# Patient Record
Sex: Male | Born: 1985 | Race: White | Hispanic: No | Marital: Married | Smoking: Never smoker
Health system: Southern US, Community
[De-identification: ages and names within clinical notes are randomized; demographics above are authoritative.]

## PROBLEM LIST (undated history)

## (undated) DIAGNOSIS — M7732 Calcaneal spur, left foot: Secondary | ICD-10-CM

## (undated) DIAGNOSIS — T148XXA Other injury of unspecified body region, initial encounter: Secondary | ICD-10-CM

## (undated) DIAGNOSIS — K219 Gastro-esophageal reflux disease without esophagitis: Secondary | ICD-10-CM

## (undated) DIAGNOSIS — F909 Attention-deficit hyperactivity disorder, unspecified type: Secondary | ICD-10-CM

## (undated) DIAGNOSIS — G43909 Migraine, unspecified, not intractable, without status migrainosus: Secondary | ICD-10-CM

## (undated) DIAGNOSIS — Z8489 Family history of other specified conditions: Secondary | ICD-10-CM

## (undated) HISTORY — DX: Attention-deficit hyperactivity disorder, unspecified type: F90.9

## (undated) HISTORY — PX: OTHER SURGICAL HISTORY: SHX169

## (undated) HISTORY — PX: NO PAST SURGERIES: SHX2092

---

## 2014-12-24 DIAGNOSIS — M7732 Calcaneal spur, left foot: Secondary | ICD-10-CM

## 2014-12-24 DIAGNOSIS — IMO0002 Reserved for concepts with insufficient information to code with codable children: Secondary | ICD-10-CM

## 2014-12-24 HISTORY — DX: Calcaneal spur, left foot: M77.32

## 2014-12-24 HISTORY — DX: Reserved for concepts with insufficient information to code with codable children: IMO0002

## 2015-01-17 ENCOUNTER — Other Ambulatory Visit: Payer: Self-pay | Admitting: Physician Assistant

## 2015-01-17 NOTE — H&P (Signed)
Ethan Gomez comes in for follow up.  I went over his MRI scan.  Continues to be markedly symptomatic.  This has been off and on, coming and going, for more than a year.  All symptoms are lateral.  MRI shows marked tendinopathy and tearing of the peroneus brevis tendon.  There is even a large tubercle on the os calcis at the peroneal site with edema within the bone.  This is right where he has a painful knot.  DISPOSITION:  I went over his scan, his report, his history, his workup and treatment to date.  More than 25 minutes spent face-to-face covering things with him.  This has been going on for more than a year and in talking with him it is almost two years.  We have discussed operative intervention that he wants to pursue.  Plan is exploration laterally, debridement and repair of peroneus brevis tendon.  If this is markedly torn I told him I am going to resect that portion and do a side-to-side transfer to the longus proximally and resect a portion of the tendon distally.  At the same time we will address the prominent bone of the os calcis, contouring and denuding that as well.  I think this can be done outpatient.  Bulky splint non-weight bearing for one week and then I will let him be weight bearing as tolerated in a boot for up to six weeks post-op.  I don't think anything short of this is going to get things to resolve and he understands and agrees.  Paperwork complete.  All questions answered.  We need to make a decision about work and how long he is going to be out and whether this can be modified.  That will be addressed when he gets some more information about light duty options.  See him at the time of operative intervention.    Loreta Ave, M.D.

## 2015-01-21 ENCOUNTER — Encounter (HOSPITAL_BASED_OUTPATIENT_CLINIC_OR_DEPARTMENT_OTHER): Payer: Self-pay | Admitting: *Deleted

## 2015-01-24 ENCOUNTER — Other Ambulatory Visit: Payer: Self-pay | Admitting: Physician Assistant

## 2015-01-24 NOTE — H&P (Signed)
This is a 28 year-old gentleman who presents to our clinic today for follow up examination of his left peroneal tendonitis.  This has been ongoing for the past six weeks.  Ethan Gomez has been in a CAM walker weight bearing as tolerated for the past three weeks or so.  He states this is minimally improved, but still continues to be quite bothersome.  He has been taking Tramadol which does seem to help.  He is on his feet all day and feels as though the pain is worse at night.  He continues to have significant swelling towards the end of the day however.   Past medical, social and family history reviewed in detail on the patient questionnaire and signed.  Review of systems: As detailed in HPI.  All others reviewed and are negative.      EXAMINATION: Well-developed, well-nourished male in no acute distress.  Alert and oriented x 3.  Examination of his left ankle reveals marked tenderness at the peroneal attachment.  Pain with eversion.  Mild swelling.   IMPRESSION: Left peroneal tendonitis.    PLAN: Due to the fact this has been ongoing for the past six weeks and the pain is still fairly dramatic, we feel it is appropriate to proceed with an MRI to assess his peroneal tendon.  If there is in fact a split tear with an intact tendon, we will have him continue in a CAM walker and let this continue to heal on its own.  If he does have a transverse tear to the tendon he will follow back up with us as this will need formal operation.  He will call us after his MRI is complete and we will go from there.    Daniel F. Murphy, M.D.    Addendum:  Ethan Gomez comes in for follow up.  I went over his MRI scan.  Continues to be markedly symptomatic.  This has been off and on, coming and going, for more than a year.  All symptoms are lateral.  MRI shows marked tendinopathy and tearing of the peroneus brevis tendon.  There is even a large tubercle on the os calcis at the peroneal site with edema within the bone.  This is right  where he has a painful knot.  DISPOSITION:  I went over his scan, his report, his history, his workup and treatment to date.  More than 25 minutes spent face-to-face covering things with him.  This has been going on for more than a year and in talking with him it is almost two years.  We have discussed operative intervention that he wants to pursue.  Plan is exploration laterally, debridement and repair of peroneus brevis tendon.  If this is markedly torn I told him I am going to resect that portion and do a side-to-side transfer to the longus proximally and resect a portion of the tendon distally.  At the same time we will address the prominent bone of the os calcis, contouring and denuding that as well.  I think this can be done outpatient.  Bulky splint non-weight bearing for one week and then I will let him be weight bearing as tolerated in a boot for up to six weeks post-op.  I don't think anything short of this is going to get things to resolve and he understands and agrees.  Paperwork complete.  All questions answered.  We need to make a decision about work and how long he is going to be out and whether this can   be modified.  That will be addressed when he gets some more information about light duty options.  See him at the time of operative intervention.    Daniel F. Murphy, M.D.  

## 2015-01-26 NOTE — H&P (View-Only) (Signed)
Ethan Gomez comes in for follow up.  I went over his MRI scan.  Continues to be markedly symptomatic.  This has been off and on, coming and going, for more than a year.  All symptoms are lateral.  MRI shows marked tendinopathy and tearing of the peroneus brevis tendon.  There is even a large tubercle on the os calcis at the peroneal site with edema within the bone.  This is right where he has a painful knot.  DISPOSITION:  I went over his scan, his report, his history, his workup and treatment to date.  More than 25 minutes spent face-to-face covering things with him.  This has been going on for more than a year and in talking with him it is almost two years.  We have discussed operative intervention that he wants to pursue.  Plan is exploration laterally, debridement and repair of peroneus brevis tendon.  If this is markedly torn I told him I am going to resect that portion and do a side-to-side transfer to the longus proximally and resect a portion of the tendon distally.  At the same time we will address the prominent bone of the os calcis, contouring and denuding that as well.  I think this can be done outpatient.  Bulky splint non-weight bearing for one week and then I will let him be weight bearing as tolerated in a boot for up to six weeks post-op.  I don't think anything short of this is going to get things to resolve and he understands and agrees.  Paperwork complete.  All questions answered.  We need to make a decision about work and how long he is going to be out and whether this can be modified.  That will be addressed when he gets some more information about light duty options.  See him at the time of operative intervention.    Daniel F. Murphy, M.D.  

## 2015-01-26 NOTE — Interval H&P Note (Signed)
History and Physical Interval Note:  01/26/2015 8:33 AM  Ethan Gomez  has presented today for surgery, with the diagnosis of CALCANEAL SPUR LEFT FOOT, SPONTANEOUS RUPTURE OF OTHER TENDONS LEFT ANKLE AND FOOT  The various methods of treatment have been discussed with the patient and family. After consideration of risks, benefits and other options for treatment, the patient has consented to  Procedure(s) with comments: LEFT HEEL EXCISE OS CALCANEAL SPUR  (Left) - ANESTHESIA: GENERAL, BLOCK REPAIR LEFT PERONEAL TENDON, POSSIBLE TENDON TRANSFER (Left) as a surgical intervention .  The patient's history has been reviewed, patient examined, no change in status, stable for surgery.  I have reviewed the patient's chart and labs.  Questions were answered to the patient's satisfaction.     Loreta Ave

## 2015-01-27 ENCOUNTER — Encounter (HOSPITAL_BASED_OUTPATIENT_CLINIC_OR_DEPARTMENT_OTHER)
Admission: RE | Disposition: A | Discharge status: Home / Self Care | Payer: Self-pay | Source: Ambulatory Visit | Attending: Orthopedic Surgery

## 2015-01-27 ENCOUNTER — Ambulatory Visit (HOSPITAL_BASED_OUTPATIENT_CLINIC_OR_DEPARTMENT_OTHER)
Admission: RE | Admit: 2015-01-27 | Discharge: 2015-01-27 | Disposition: A | Discharge status: Home / Self Care | Payer: 59 | Source: Ambulatory Visit | Attending: Orthopedic Surgery | Admitting: Orthopedic Surgery

## 2015-01-27 ENCOUNTER — Ambulatory Visit (HOSPITAL_BASED_OUTPATIENT_CLINIC_OR_DEPARTMENT_OTHER): Payer: 59 | Admitting: Certified Registered"

## 2015-01-27 ENCOUNTER — Encounter (HOSPITAL_BASED_OUTPATIENT_CLINIC_OR_DEPARTMENT_OTHER): Payer: Self-pay | Admitting: *Deleted

## 2015-01-27 DIAGNOSIS — S96812A Strain of other specified muscles and tendons at ankle and foot level, left foot, initial encounter: Secondary | ICD-10-CM | POA: Diagnosis not present

## 2015-01-27 DIAGNOSIS — M65872 Other synovitis and tenosynovitis, left ankle and foot: Secondary | ICD-10-CM | POA: Diagnosis present

## 2015-01-27 DIAGNOSIS — X58XXXA Exposure to other specified factors, initial encounter: Secondary | ICD-10-CM | POA: Diagnosis not present

## 2015-01-27 HISTORY — DX: Gastro-esophageal reflux disease without esophagitis: K21.9

## 2015-01-27 HISTORY — PX: BONE EXOSTOSIS EXCISION: SHX1249

## 2015-01-27 HISTORY — DX: Migraine, unspecified, not intractable, without status migrainosus: G43.909

## 2015-01-27 HISTORY — DX: Calcaneal spur, left foot: M77.32

## 2015-01-27 HISTORY — DX: Family history of other specified conditions: Z84.89

## 2015-01-27 HISTORY — DX: Other injury of unspecified body region, initial encounter: T14.8XXA

## 2015-01-27 HISTORY — PX: TENDON REPAIR: SHX5111

## 2015-01-27 LAB — POCT HEMOGLOBIN-HEMACUE: Hemoglobin: 14.9 g/dL (ref 13.0–17.0)

## 2015-01-27 SURGERY — EXCISION, EXOSTOSIS
Anesthesia: Regional | Site: Foot | Laterality: Left

## 2015-01-27 MED ORDER — CHLORHEXIDINE GLUCONATE 4 % EX LIQD: 60.0000 mL | Freq: Once | CUTANEOUS | Status: DC

## 2015-01-27 MED ORDER — LIDOCAINE HCL (CARDIAC) 20 MG/ML IV SOLN
INTRAVENOUS | Status: DC | PRN
  Administered 2015-01-27: 30 mg via INTRAVENOUS

## 2015-01-27 MED ORDER — FENTANYL CITRATE (PF) 100 MCG/2ML IJ SOLN
INTRAMUSCULAR | Status: AC
  Filled 2015-01-27: qty 2

## 2015-01-27 MED ORDER — 0.9 % SODIUM CHLORIDE (POUR BTL) OPTIME
TOPICAL | Status: DC | PRN
  Administered 2015-01-27: 120 mL

## 2015-01-27 MED ORDER — FENTANYL CITRATE (PF) 100 MCG/2ML IJ SOLN
INTRAMUSCULAR | Status: AC
  Filled 2015-01-27: qty 6

## 2015-01-27 MED ORDER — ONDANSETRON HCL 4 MG/2ML IJ SOLN
INTRAMUSCULAR | Status: DC | PRN
  Administered 2015-01-27: 4 mg via INTRAVENOUS

## 2015-01-27 MED ORDER — MIDAZOLAM HCL 2 MG/2ML IJ SOLN
INTRAMUSCULAR | Status: AC
  Filled 2015-01-27: qty 2

## 2015-01-27 MED ORDER — GLYCOPYRROLATE 0.2 MG/ML IJ SOLN: 0.2000 mg | Freq: Once | INTRAMUSCULAR | Status: DC | PRN

## 2015-01-27 MED ORDER — CEFAZOLIN SODIUM-DEXTROSE 2-3 GM-% IV SOLR
INTRAVENOUS | Status: AC
  Filled 2015-01-27: qty 50

## 2015-01-27 MED ORDER — SCOPOLAMINE 1 MG/3DAYS TD PT72: 1.0000 | MEDICATED_PATCH | Freq: Once | TRANSDERMAL | Status: DC | PRN

## 2015-01-27 MED ORDER — ONDANSETRON HCL 4 MG PO TABS: 4.0000 mg | ORAL_TABLET | Freq: Three times a day (TID) | ORAL | Status: DC | PRN

## 2015-01-27 MED ORDER — LACTATED RINGERS IV SOLN: INTRAVENOUS | Status: DC

## 2015-01-27 MED ORDER — DEXAMETHASONE SODIUM PHOSPHATE 10 MG/ML IJ SOLN
INTRAMUSCULAR | Status: DC | PRN
  Administered 2015-01-27: 10 mg via INTRAVENOUS

## 2015-01-27 MED ORDER — PROPOFOL 10 MG/ML IV BOLUS
INTRAVENOUS | Status: DC | PRN
  Administered 2015-01-27: 200 mg via INTRAVENOUS

## 2015-01-27 MED ORDER — CEFAZOLIN SODIUM-DEXTROSE 2-3 GM-% IV SOLR
2.0000 g | INTRAVENOUS | Status: AC
  Administered 2015-01-27: 2 g via INTRAVENOUS

## 2015-01-27 MED ORDER — MIDAZOLAM HCL 2 MG/2ML IJ SOLN
1.0000 mg | INTRAMUSCULAR | Status: DC | PRN
  Administered 2015-01-27: 1 mg via INTRAVENOUS
  Administered 2015-01-27: 2 mg via INTRAVENOUS

## 2015-01-27 MED ORDER — LACTATED RINGERS IV SOLN
INTRAVENOUS | Status: DC
  Administered 2015-01-27 (×3): via INTRAVENOUS

## 2015-01-27 MED ORDER — OXYCODONE-ACETAMINOPHEN 5-325 MG PO TABS: 1.0000 | ORAL_TABLET | ORAL | Status: DC | PRN

## 2015-01-27 MED ORDER — FENTANYL CITRATE (PF) 100 MCG/2ML IJ SOLN
50.0000 ug | INTRAMUSCULAR | Status: DC | PRN
  Administered 2015-01-27 (×2): 100 ug via INTRAVENOUS

## 2015-01-27 SURGICAL SUPPLY — 70 items
BANDAGE ELASTIC 4 VELCRO ST LF (GAUZE/BANDAGES/DRESSINGS) ×4 IMPLANT
BANDAGE ELASTIC 6 VELCRO ST LF (GAUZE/BANDAGES/DRESSINGS) ×4 IMPLANT
BANDAGE ESMARK 6X9 LF (GAUZE/BANDAGES/DRESSINGS) ×2 IMPLANT
BENZOIN TINCTURE PRP APPL 2/3 (GAUZE/BANDAGES/DRESSINGS) ×4 IMPLANT
BLADE AVERAGE 25MMX9MM (BLADE)
BLADE AVERAGE 25X9 (BLADE) IMPLANT
BLADE OSC/SAG .038X5.5 CUT EDG (BLADE) IMPLANT
BLADE SURG 15 STRL LF DISP TIS (BLADE) ×2 IMPLANT
BLADE SURG 15 STRL SS (BLADE) ×2
BNDG COHESIVE 4X5 TAN STRL (GAUZE/BANDAGES/DRESSINGS) ×4 IMPLANT
BNDG ESMARK 6X9 LF (GAUZE/BANDAGES/DRESSINGS) ×4
CANISTER SUCT 1200ML W/VALVE (MISCELLANEOUS) IMPLANT
CLOSURE WOUND 1/2 X4 (GAUZE/BANDAGES/DRESSINGS) ×1
COVER BACK TABLE 60X90IN (DRAPES) ×4 IMPLANT
CUFF TOURNIQUET SINGLE 34IN LL (TOURNIQUET CUFF) ×4 IMPLANT
DECANTER SPIKE VIAL GLASS SM (MISCELLANEOUS) IMPLANT
DRAPE EXTREMITY T 121X128X90 (DRAPE) ×4 IMPLANT
DRAPE OEC MINIVIEW 54X84 (DRAPES) IMPLANT
DRAPE U 20/CS (DRAPES) ×4 IMPLANT
DRAPE U-SHAPE 47X51 STRL (DRAPES) ×4 IMPLANT
DURAPREP 26ML APPLICATOR (WOUND CARE) ×4 IMPLANT
ELECT REM PT RETURN 9FT ADLT (ELECTROSURGICAL) ×4
ELECTRODE REM PT RTRN 9FT ADLT (ELECTROSURGICAL) ×2 IMPLANT
GAUZE SPONGE 4X4 12PLY STRL (GAUZE/BANDAGES/DRESSINGS) ×4 IMPLANT
GAUZE XEROFORM 1X8 LF (GAUZE/BANDAGES/DRESSINGS) ×4 IMPLANT
GLOVE BIOGEL PI IND STRL 7.0 (GLOVE) ×4 IMPLANT
GLOVE BIOGEL PI INDICATOR 7.0 (GLOVE) ×4
GLOVE ECLIPSE 7.0 STRL STRAW (GLOVE) IMPLANT
GLOVE EXAM NITRILE LRG STRL (GLOVE) ×4 IMPLANT
GLOVE ORTHO TXT STRL SZ7.5 (GLOVE) IMPLANT
GLOVE SURG ORTHO 8.0 STRL STRW (GLOVE) IMPLANT
GLOVE SURG SS PI 6.5 STRL IVOR (GLOVE) ×4 IMPLANT
GLOVE SURG SS PI 7.0 STRL IVOR (GLOVE) ×8 IMPLANT
GLOVE SURG SS PI 8.0 STRL IVOR (GLOVE) ×4 IMPLANT
GOWN STRL REUS W/ TWL LRG LVL3 (GOWN DISPOSABLE) ×6 IMPLANT
GOWN STRL REUS W/ TWL XL LVL3 (GOWN DISPOSABLE) ×2 IMPLANT
GOWN STRL REUS W/TWL LRG LVL3 (GOWN DISPOSABLE) ×6
GOWN STRL REUS W/TWL XL LVL3 (GOWN DISPOSABLE) ×2
KIT SUTURETAK 3 SPEAR TROCAR (KITS) IMPLANT
NEEDLE HYPO 25X1 1.5 SAFETY (NEEDLE) IMPLANT
NS IRRIG 1000ML POUR BTL (IV SOLUTION) ×4 IMPLANT
PACK BASIN DAY SURGERY FS (CUSTOM PROCEDURE TRAY) ×4 IMPLANT
PAD CAST 3X4 CTTN HI CHSV (CAST SUPPLIES) IMPLANT
PAD CAST 4YDX4 CTTN HI CHSV (CAST SUPPLIES) ×4 IMPLANT
PADDING CAST COTTON 3X4 STRL (CAST SUPPLIES)
PADDING CAST COTTON 4X4 STRL (CAST SUPPLIES) ×4
PENCIL BUTTON HOLSTER BLD 10FT (ELECTRODE) ×4 IMPLANT
SLEEVE SCD COMPRESS KNEE MED (MISCELLANEOUS) ×4 IMPLANT
SPLINT FAST PLASTER 5X30 (CAST SUPPLIES) ×40
SPLINT FIBERGLASS 3X35 (CAST SUPPLIES) IMPLANT
SPLINT FIBERGLASS 4X30 (CAST SUPPLIES) IMPLANT
SPLINT PLASTER CAST FAST 5X30 (CAST SUPPLIES) ×40 IMPLANT
SPONGE LAP 4X18 X RAY DECT (DISPOSABLE) ×4 IMPLANT
STOCKINETTE 4X48 STRL (DRAPES) ×4 IMPLANT
STRIP CLOSURE SKIN 1/2X4 (GAUZE/BANDAGES/DRESSINGS) ×3 IMPLANT
SUCTION FRAZIER TIP 10 FR DISP (SUCTIONS) IMPLANT
SUT ETHIBOND 2 OS 4 DA (SUTURE) IMPLANT
SUT ETHILON 3 0 PS 1 (SUTURE) ×8 IMPLANT
SUT VIC AB 0 SH 27 (SUTURE) IMPLANT
SUT VIC AB 2-0 SH 27 (SUTURE) ×4
SUT VIC AB 2-0 SH 27XBRD (SUTURE) ×4 IMPLANT
SUT VIC AB 3-0 SH 27 (SUTURE)
SUT VIC AB 3-0 SH 27X BRD (SUTURE) IMPLANT
SUT VICRYL 4-0 PS2 18IN ABS (SUTURE) IMPLANT
SYR BULB 3OZ (MISCELLANEOUS) ×4 IMPLANT
SYR CONTROL 10ML LL (SYRINGE) IMPLANT
TUBE CONNECTING 20'X1/4 (TUBING)
TUBE CONNECTING 20X1/4 (TUBING) IMPLANT
UNDERPAD 30X30 (UNDERPADS AND DIAPERS) ×4 IMPLANT
YANKAUER SUCT BULB TIP NO VENT (SUCTIONS) IMPLANT

## 2015-01-27 NOTE — Anesthesia Procedure Notes (Addendum)
Procedure Name: LMA Insertion Date/Time: 01/27/2015 3:06 PM Performed by: BLOCKER, TIMOTHY D Pre-anesthesia Checklist: Patient identified, Emergency Drugs available, Suction available and Patient being monitored Patient Re-evaluated:Patient Re-evaluated prior to inductionOxygen Delivery Method: Circle System Utilized Preoxygenation: Pre-oxygenation with 100% oxygen Intubation Type: IV induction Ventilation: Mask ventilation without difficulty LMA: LMA inserted LMA Size: 4.5 Number of attempts: 1 Airway Equipment and Method: Bite block Placement Confirmation: positive ETCO2 Tube secured with: Tape Dental Injury: Teeth and Oropharynx as per pre-operative assessment     Anesthesia Regional Block:  Popliteal block  Pre-Anesthetic Checklist: ,, timeout performed, Correct Patient, Correct Site, Correct Laterality, Correct Procedure, Correct Position, site marked, Risks and benefits discussed,  Surgical consent,  Pre-op evaluation,  At surgeon's request and post-op pain management  Laterality: Left and Lower  Prep: chloraprep       Needles:  Injection technique: Single-shot  Needle Type: Echogenic Needle     Needle Length: 9cm 9 cm Needle Gauge: 21 and 21 G    Additional Needles:  Procedures: ultrasound guided (picture in chart) Popliteal block Narrative:  Start time: 02/14/2015 2:36 PM End time: 02/14/2015 2:40 PM Injection made incrementally with aspirations every 5 mL.  Performed by: Personally  Anesthesiologist: Anapaola Kinsel

## 2015-01-27 NOTE — Interval H&P Note (Signed)
History and Physical Interval Note:  01/27/2015 9:45 AM  Ethan Gomez  has presented today for surgery, with the diagnosis of CALCANEAL SPUR LEFT FOOT, SPONTANEOUS RUPTURE OF OTHER TENDONS LEFT ANKLE AND FOOT  The various methods of treatment have been discussed with the patient and family. After consideration of risks, benefits and other options for treatment, the patient has consented to  Procedure(s) with comments: LEFT HEEL EXCISE OS CALCANEAL SPUR  (Left) - ANESTHESIA: GENERAL, BLOCK REPAIR LEFT PERONEAL TENDON, POSSIBLE TENDON TRANSFER (Left) as a surgical intervention .  The patient's history has been reviewed, patient examined, no change in status, stable for surgery.  I have reviewed the patient's chart and labs.  Questions were answered to the patient's satisfaction.     Loreta Ave

## 2015-01-27 NOTE — Anesthesia Preprocedure Evaluation (Signed)
Anesthesia Evaluation  Patient identified by MRN, date of birth, ID band Patient awake    Reviewed: Allergy & Precautions, NPO status , Patient's Chart, lab work & pertinent test results  Airway Mallampati: I  TM Distance: >3 FB Neck ROM: Full    Dental  (+) Teeth Intact, Dental Advisory Given   Pulmonary  breath sounds clear to auscultation        Cardiovascular Rhythm:Regular Rate:Normal     Neuro/Psych    GI/Hepatic GERD-  Medicated and Controlled,  Endo/Other    Renal/GU      Musculoskeletal   Abdominal   Peds  Hematology   Anesthesia Other Findings   Reproductive/Obstetrics                             Anesthesia Physical Anesthesia Plan  ASA: I  Anesthesia Plan: General and Regional   Post-op Pain Management:    Induction: Intravenous  Airway Management Planned: LMA  Additional Equipment:   Intra-op Plan:   Post-operative Plan: Extubation in OR  Informed Consent: I have reviewed the patients History and Physical, chart, labs and discussed the procedure including the risks, benefits and alternatives for the proposed anesthesia with the patient or authorized representative who has indicated his/her understanding and acceptance.   Dental advisory given  Plan Discussed with: CRNA, Anesthesiologist and Surgeon  Anesthesia Plan Comments:         Anesthesia Quick Evaluation

## 2015-01-27 NOTE — Transfer of Care (Signed)
Immediate Anesthesia Transfer of Care Note  Patient: Ethan Gomez  Procedure(s) Performed: Procedure(s) with comments: EXCISION LEFT HEEL OS CALCANEAL SPUR  (Left) - ANESTHESIA: GENERAL, BLOCK REPAIR LEFT PERONEUS LONGUS TENDON (Left)  Patient Location: PACU  Anesthesia Type:GA combined with regional for post-op pain  Level of Consciousness: awake, alert , oriented and patient cooperative  Airway & Oxygen Therapy: Patient Spontanous Breathing and Patient connected to face mask oxygen  Post-op Assessment: Report given to RN and Post -op Vital signs reviewed and stable  Post vital signs: Reviewed and stable  Last Vitals:  Filed Vitals:   01/27/15 1631  BP:   Pulse: 69  Temp:   Resp:     Complications: No apparent anesthesia complications

## 2015-01-27 NOTE — Progress Notes (Signed)
Assisted Dr. Crews with left, ultrasound guided, popliteal block. Side rails up, monitors on throughout procedure. See vital signs in flow sheet. Tolerated Procedure well. 

## 2015-01-27 NOTE — H&P (View-Only) (Signed)
This is a 29 year-old gentleman who presents to our clinic today for follow up examination of his left peroneal tendonitis.  This has been ongoing for the past six weeks.  Sharia Reeve has been in a CAM walker weight bearing as tolerated for the past three weeks or so.  He states this is minimally improved, but still continues to be quite bothersome.  He has been taking Tramadol which does seem to help.  He is on his feet all day and feels as though the pain is worse at night.  He continues to have significant swelling towards the end of the day however.   Past medical, social and family history reviewed in detail on the patient questionnaire and signed.  Review of systems: As detailed in HPI.  All others reviewed and are negative.      EXAMINATION: Well-developed, well-nourished male in no acute distress.  Alert and oriented x 3.  Examination of his left ankle reveals marked tenderness at the peroneal attachment.  Pain with eversion.  Mild swelling.   IMPRESSION: Left peroneal tendonitis.    PLAN: Due to the fact this has been ongoing for the past six weeks and the pain is still fairly dramatic, we feel it is appropriate to proceed with an MRI to assess his peroneal tendon.  If there is in fact a split tear with an intact tendon, we will have him continue in a CAM walker and let this continue to heal on its own.  If he does have a transverse tear to the tendon he will follow back up with [place] as this will need formal operation.  He will call us after his MRI is complete and we will go from there.    Loreta Ave, M.D.    Addendum:  Sagar comes in for follow up.  I went over his MRI scan.  Continues to be markedly symptomatic.  This has been off and on, coming and going, for more than a year.  All symptoms are lateral.  MRI shows marked tendinopathy and tearing of the peroneus brevis tendon.  There is even a large tubercle on the os calcis at the peroneal site with edema within the bone.  This is right  where he has a painful knot.  DISPOSITION:  I went over his scan, his report, his history, his workup and treatment to date.  More than 25 minutes spent face-to-face covering things with him.  This has been going on for more than a year and in talking with him it is almost two years.  We have discussed operative intervention that he wants to pursue.  Plan is exploration laterally, debridement and repair of peroneus brevis tendon.  If this is markedly torn I told him I am going to resect that portion and do a side-to-side transfer to the longus proximally and resect a portion of the tendon distally.  At the same time we will address the prominent bone of the os calcis, contouring and denuding that as well.  I think this can be done outpatient.  Bulky splint non-weight bearing for one week and then I will let him be weight bearing as tolerated in a boot for up to six weeks post-op.  I don't think anything short of this is going to get things to resolve and he understands and agrees.  Paperwork complete.  All questions answered.  We need to make a decision about work and how long he is going to be out and whether this can  be modified.  That will be addressed when he gets some more information about light duty options.  See him at the time of operative intervention.    Loreta Ave, M.D.

## 2015-01-27 NOTE — Discharge Instructions (Signed)
°Care After °Refer to this sheet in the next few weeks. These discharge instructions provide you with general information on caring for yourself after you leave the hospital. Your caregiver may also give you specific instructions. Your treatment has been planned according to the most current medical practices available, but unavoidable complications sometimes occur. If you have any problems or questions after discharge, please call your caregiver. °HOME INSTRUCTIONS °You may resume a normal diet and activities as directed. Walk with crutches NON WEIGHT BEARING °Do NOT get cast wet.  Do NOT remove dressing until you come back to the doctor °Only take over-the-counter or prescription medicines for pain, discomfort, or fever as directed by your caregiver.  °Eat a well-balanced diet.  °Avoid lifting or driving until you are instructed otherwise.  °Make an appointment to see your caregiver for stitches (suture) or staple removal as directed.  ° °SEEK MEDICAL CARE IF: °You have swelling of your calf or leg.  °You develop shortness of breath or chest pain.  °You have redness, swelling, or increasing pain in the wound.  °There is pus or any unusual drainage coming from the surgical site.  °You notice a bad smell coming from the surgical site or dressing.  °The surgical site breaks open after sutures or staples have been removed.  °There is persistent bleeding from the suture or staple line.  °You are getting worse or are not improving.  °You have any other questions or concerns.  °SEEK IMMEDIATE MEDICAL CARE IF:  °You have a fever.  °You develop a rash.  °You have difficulty breathing.  °You develop any reaction or side effects to medicines given.  °Your knee motion is decreasing rather than improving.  °MAKE SURE YOU:  °Understand these instructions.  °Will watch your condition.  °Will get help right away if you are not doing well or get worse.  ° °Regional Anesthesia Blocks ° °1. Numbness or the inability to move the  "blocked" extremity may last from 3-48 hours after placement. The length of time depends on the medication injected and your individual response to the medication. If the numbness is not going away after 48 hours, call your surgeon. ° °2. The extremity that is blocked will need to be protected until the numbness is gone and the  Strength has returned. Because you cannot feel it, you will need to take extra care to avoid injury. Because it may be weak, you may have difficulty moving it or using it. You may not know what position it is in without looking at it while the block is in effect. ° °3. For blocks in the legs and feet, returning to weight bearing and walking needs to be done carefully. You will need to wait until the numbness is entirely gone and the strength has returned. You should be able to move your leg and foot normally before you try and bear weight or walk. You will need someone to be with you when you first try to ensure you do not fall and possibly risk injury. ° °4. Bruising and tenderness at the needle site are common side effects and will resolve in a few days. ° °5. Persistent numbness or new problems with movement should be communicated to the surgeon or the [facility] ([phone])/ [facility] ([phone]). ° °Post Anesthesia Home Care Instructions ° °Activity: °Get plenty of rest for the remainder of the day. A responsible adult should stay with you for 24 hours following the procedure.  °For   the next 24 hours, DO NOT: °-Drive a car °-Operate machinery °-Drink alcoholic beverages °-Take any medication unless instructed by your physician °-Make any legal decisions or sign important papers. ° °Meals: °Start with liquid foods such as gelatin or soup. Progress to regular foods as tolerated. Avoid greasy, spicy, heavy foods. If nausea and/or vomiting occur, drink only clear liquids until the nausea and/or vomiting subsides. Call your physician if vomiting  continues. ° °Special Instructions/Symptoms: °Your throat may feel dry or sore from the anesthesia or the breathing tube placed in your throat during surgery. If this causes discomfort, gargle with warm salt water. The discomfort should disappear within 24 hours. ° °If you had a scopolamine patch placed behind your ear for the management of post- operative nausea and/or vomiting: ° °1. The medication in the patch is effective for 72 hours, after which it should be removed.  Wrap patch in a tissue and discard in the trash. Wash hands thoroughly with soap and water. °2. You may remove the patch earlier than 72 hours if you experience unpleasant side effects which may include dry mouth, dizziness or visual disturbances. °3. Avoid touching the patch. Wash your hands with soap and water after contact with the patch. °  ° ° ° °

## 2015-01-27 NOTE — Anesthesia Postprocedure Evaluation (Signed)
  Anesthesia Post-op Note  Patient: Ethan Gomez  Procedure(s) Performed: Procedure(s) with comments: EXCISION LEFT HEEL OS CALCANEAL SPUR  (Left) - ANESTHESIA: GENERAL, BLOCK REPAIR LEFT PERONEUS LONGUS TENDON (Left)  Patient Location: PACU  Anesthesia Type:General  Level of Consciousness: awake  Airway and Oxygen Therapy: Patient Spontanous Breathing  Post-op Pain: mild  Post-op Assessment: Post-op Vital signs reviewed LLE Motor Response: No movement to painful stimulus LLE Sensation: Other (Comment) (UTA pt asleep)          Post-op Vital Signs: Reviewed  Last Vitals:  Filed Vitals:   01/27/15 1631  BP:   Pulse: 69  Temp:   Resp:     Complications: No apparent anesthesia complications

## 2015-01-28 ENCOUNTER — Encounter (HOSPITAL_BASED_OUTPATIENT_CLINIC_OR_DEPARTMENT_OTHER): Payer: Self-pay | Admitting: Orthopedic Surgery

## 2015-01-28 NOTE — Op Note (Signed)
NAMEDAIQUAN, RESNIK NO.:  [id number]  MEDICAL RECORD NO.:  [id number]  LOCATION:                               FACILITY:  [facility]  PHYSICIAN:  Loreta Ave, M.D. DATE OF BIRTH:  [birth year]  DATE OF PROCEDURE:  01/27/2015 DATE OF DISCHARGE:  01/27/2015                              OPERATIVE REPORT   PREOPERATIVE DIAGNOSES:  Left ankle chronic tenosynovitis, peroneal tendons with tearing of the peroneus longus.  Marked prominence of the calcaneal peroneal tubercle with underlying bony edema.  POSTOPERATIVE DIAGNOSES:  Left ankle chronic tenosynovitis, peroneal tendons with tearing of the peroneus longus.  Marked prominence of the calcaneal peroneal tubercle with underlying bony edema with extensive longitudinal tearing of the peroneus longus, but functionally intact. Tenosynovitis throughout the peroneus brevis, but longitudinally intact. Marked prominence irregularity of the calcaneal peroneal tubercle.  PROCEDURE:  Left ankle exploration.  Tenosynovectomy peroneus brevis. Debridement, longitudinal repair of peroneus longus.  Removal of exostosis prominent tubercle from the lateral os calcis.  SURGEON:  Loreta Ave, M.D.  ASSISTANT:  Mikey Kirschner, PA, present throughout the entire case and necessary for timely completion of procedure.  ANESTHESIA:  General.  BLOOD LOSS:  Minimal.  SPECIMENS:  None.  CULTURES:  None.  COMPLICATIONS:  None.  DRESSING:  Soft compressive.  TOURNIQUET TIME:  45 minutes.  DESCRIPTION OF PROCEDURE:  The patient was brought to the operating room, placed on the operating table in supine position.  After adequate anesthesia had been obtained, thigh tourniquet applied.  Turned to a slight lateral position for access to the outside of the ankle.  Prepped and draped in usual sterile fashion.  Exsanguinated with elevation of Esmarch.  Tourniquet inflated to 350 mmHg.  Longitudinal incision from just behind the  fibula down towards the base of the 5th metatarsal. Skin and subcutaneous tissue divided.  Markedly thickened, swollen peroneal tendon sheaths.  These were all opened exposing both tendons. Retinaculum behind the fibula left intact so it would not sublux. Tenosynovium fluid debrided throughout.  The brevis was intact.  Marked prominence of the peroneal tubercle on the os calcis sitting right under longitudinal tear of the longus as well as to the extent over the brevis.  Both tendons were elevated.  The side of the os calcis contoured smoothly removing all exostosis.  I then debrided out and debulked the longest tendon and did a primary closure of the longitudinal tear throughout.  Wound irrigated.  Wound closed with Vicryl and nylon.  Sterile compressive dressing applied.  Short leg splint applied.  Tourniquet inflated was removed.  Anesthesia reversed. Brought to the recovery room.  Tolerated the surgery well.  No complications.     Loreta Ave, M.D.     DFM/MEDQ  D:  01/28/2015  T:  01/28/2015  Job:  [id number]

## 2015-02-14 MED ORDER — BUPIVACAINE-EPINEPHRINE (PF) 0.5% -1:200000 IJ SOLN
INTRAMUSCULAR | Status: DC | PRN
  Administered 2015-01-27: 25 mL via PERINEURAL

## 2015-02-14 NOTE — Addendum Note (Signed)
Addendum  created 02/14/15 1318 by Sheldon Silvan, MD   Modules edited: Anesthesia Blocks and Procedures, Anesthesia Medication Administration, Clinical Notes   Clinical Notes:  File: [id number]; File: [id number]

## 2015-10-06 ENCOUNTER — Encounter: Payer: Self-pay | Admitting: Internal Medicine

## 2015-10-06 ENCOUNTER — Ambulatory Visit (INDEPENDENT_AMBULATORY_CARE_PROVIDER_SITE_OTHER): Payer: 59 | Admitting: Internal Medicine

## 2015-10-06 VITALS — BP 124/72 | HR 69 | Temp 98.5°F | Ht 75.5 in | Wt 285.2 lb

## 2015-10-06 DIAGNOSIS — R51 Headache: Secondary | ICD-10-CM

## 2015-10-06 DIAGNOSIS — M5136 Other intervertebral disc degeneration, lumbar region: Secondary | ICD-10-CM | POA: Diagnosis not present

## 2015-10-06 DIAGNOSIS — M66872 Spontaneous rupture of other tendons, left ankle and foot: Secondary | ICD-10-CM | POA: Diagnosis not present

## 2015-10-06 DIAGNOSIS — R519 Headache, unspecified: Secondary | ICD-10-CM | POA: Insufficient documentation

## 2015-10-06 NOTE — Assessment & Plan Note (Signed)
Encouraged weight loss Continue to follow with Dr. Jeannetta EllisBeschel

## 2015-10-06 NOTE — Progress Notes (Signed)
HPI  Pt presents to the clinic today to establish care. He has not had a PCP in many years.   Frequent Headaches: This occurs 2-3 time per month. The pain is located near his temples. He describes the pain as throbbing. He does have some blurred vision but denies dizziness. He has sensiviity to light and sound. He denies nausea or vomiting. He takes Topomax and Baclofen with good relief. He takes Diclofenac as needed if it gets very severe.  DDD lumbar spine: He sees a Landchiropractor, Dr. Jeannetta EllisBeschel, every 2 weeks for an adjustment.  Rupture of tendon of left ankle. He has had this surgically repaired but reports it is tearing apart again. He follows with Dr. Eulah PontMurphy. He takes Tramadol as needed, as much as up to 2 x day.  Flu: 03/2015 Tetanus: 2011 Dentist: yearly  Past Medical History  Diagnosis Date  . Migraines   . Acid reflux     occasional - Zantac as needed; none in 6 mos.  . Calcaneal spur of left foot 12/2014  . Rupture of tendon 12/2014    left ankle and foot  . Family history of adverse reaction to anesthesia     pt's mother has hx. of post-op N/V    Current Outpatient Prescriptions  Medication Sig Dispense Refill  . baclofen (LIORESAL) 10 MG tablet Take 10 mg by mouth as needed.    . diclofenac (CATAFLAM) 50 MG tablet Take 1 tablet by mouth 3 (three) times daily.    . Multiple Vitamin (MULTIVITAMIN) tablet Take 1 tablet by mouth daily.    Marland Kitchen. topiramate (TOPAMAX) 100 MG tablet Take 100 mg by mouth daily.    . traMADol (ULTRAM) 50 MG tablet Take 1 tablet by mouth daily as needed.     No current facility-administered medications for this visit.    Allergies  Allergen Reactions  . Latex Shortness Of Breath, Itching and Rash  . Sulfa Antibiotics Anaphylaxis  . Morphine And Related Other (See Comments)    TACHYCARDIA AND FEVER    Family History  Problem Relation Age of Onset  . Anesthesia problems Mother     post-op N/V  . Arthritis Mother   . Arthritis Other     all 4  of his grandparents  . Hyperlipidemia Other     all grandparents  . Hyperlipidemia Mother   . Hypertension Maternal Grandmother   . Hypertension Paternal Grandfather   . Hypertension Mother   . Diabetes Maternal Grandfather     Social History   Social History  . Marital Status: Married    Spouse Name: N/A  . Number of Children: N/A  . Years of Education: N/A   Occupational History  . Not on file.   Social History Main Topics  . Smoking status: Never Smoker   . Smokeless tobacco: Former NeurosurgeonUser     Comment: no smokeless tobacco since age 30  . Alcohol Use: 0.0 oz/week    0 Standard drinks or equivalent per week     Comment: rare  . Drug Use: No  . Sexual Activity: Not on file   Other Topics Concern  . Not on file   Social History Narrative    ROS:  Constitutional: Denies fever, malaise, fatigue, headache or abrupt weight changes.  HEENT: Denies eye pain, eye redness, ear pain, ringing in the ears, wax buildup, runny nose, nasal congestion, bloody nose, or sore throat. Respiratory: Denies difficulty breathing, shortness of breath, cough or sputum production.   Cardiovascular: Denies  chest pain, chest tightness, palpitations or swelling in the hands or feet.  Musculoskeletal: Pt reports left ankle pain. Denies decrease in range of motion, difficulty with gait, muscle pain or joint swelling.  Skin: Denies redness, rashes, lesions or ulcercations.  Neurological: Denies dizziness, difficulty with memory, difficulty with speech or problems with balance and coordination.  Psych: Denies anxiety, depression, SI/HI.  No other specific complaints in a complete review of systems (except as listed in HPI above).  PE:  BP 124/72 mmHg  Pulse 69  Temp(Src) 98.5 F (36.9 C) (Oral)  Ht 6' 3.5" (1.918 m)  Wt 285 lb 4 oz (129.389 kg)  BMI 35.17 kg/m2  SpO2 96% Wt Readings from Last 3 Encounters:  10/06/15 285 lb 4 oz (129.389 kg)  01/27/15 286 lb (129.729 kg)    General:  Appears his stated age, obese in NAD. 90 lb weight loss in the last 2 years. HEENT: Head: normal shape and size; Eyes: sclera white, no icterus, conjunctiva pink, PERRLA and EOMs intact;  Cardiovascular: Normal rate and rhythm. S1,S2 noted.  No murmur, rubs or gallops noted.  Pulmonary/Chest: Normal effort and positive vesicular breath sounds. No respiratory distress. No wheezes, rales or ronchi noted.  Musculoskeletal: Normal flexion and extension of the spine. No bony tenderness noted. Strength 5/5 BLE. Normal flexion, extension and rotation of the left ankle. Some pain with palpation below the medial malleolus. No signs of joint swelling. No difficulty with gait.  Neurological: Alert and oriented.  Psychiatric: Mood and affect normal. Behavior is normal. Judgment and thought content normal.     Assessment and Plan:

## 2015-10-06 NOTE — Assessment & Plan Note (Signed)
Controlled with Topomax and Baclofen He will continue Diclofenac as needed

## 2015-10-06 NOTE — Patient Instructions (Signed)

## 2015-10-06 NOTE — Progress Notes (Signed)
Pre visit review using our clinic review tool, if applicable. No additional management support is needed unless otherwise documented below in the visit note. 

## 2015-10-06 NOTE — Assessment & Plan Note (Signed)
He feels like this is occuring again Advised him to wear an ankle brace for extra support Continue Tramadol as needed Continue to follow with Dr. Eulah PontMurphy

## 2016-02-28 ENCOUNTER — Ambulatory Visit (INDEPENDENT_AMBULATORY_CARE_PROVIDER_SITE_OTHER): Payer: 59 | Admitting: Internal Medicine

## 2016-02-28 ENCOUNTER — Encounter: Payer: Self-pay | Admitting: Internal Medicine

## 2016-02-28 VITALS — BP 124/86 | HR 75 | Temp 98.6°F | Wt 294.0 lb

## 2016-02-28 DIAGNOSIS — M545 Low back pain, unspecified: Secondary | ICD-10-CM

## 2016-02-28 MED ORDER — CYCLOBENZAPRINE HCL 10 MG PO TABS: 10.0000 mg | ORAL_TABLET | Freq: Three times a day (TID) | ORAL | 0 refills | Status: DC | PRN

## 2016-02-28 NOTE — Patient Instructions (Signed)

## 2016-02-28 NOTE — Progress Notes (Signed)
Subjective:    Patient ID: Ethan Gomez, male    DOB: 1986/02/14, 30 y.o.   MRN: [id number]  HPI  Pt presents to the clinic today with c/o back pain. He reports 6 days ago, he turned over in bed, and all of a sudden felt a muscle spasm in his left leg that radiates all the way up to his lower back. After the muscle spasm, he felt really sore. 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 He reports the pain radiates along the lower back. He describes the pain as sore and achy. The pain does not radiate down his legs. He denies numbness or tingling. He denies loss of bowel or bladder. He has tried heat, Ibuprofen, and Baclofen with some relief.  Review of Systems      Past Medical History:  Diagnosis Date  . Acid reflux    occasional - Zantac as needed; none in 6 mos.  . Calcaneal spur of left foot 12/2014  . Family history of adverse reaction to anesthesia    pt's mother has hx. of post-op N/V  . Migraines   . Rupture of tendon 12/2014   left ankle and foot    Current Outpatient Prescriptions  Medication Sig Dispense Refill  . baclofen (LIORESAL) 10 MG tablet Take 10 mg by mouth as needed.    . diclofenac (CATAFLAM) 50 MG tablet Take 1 tablet by mouth 3 (three) times daily.    . Multiple Vitamin (MULTIVITAMIN) tablet Take 1 tablet by mouth daily.    Marland Kitchen topiramate (TOPAMAX) 100 MG tablet Take 100 mg by mouth daily.    . traMADol (ULTRAM) 50 MG tablet Take 1 tablet by mouth daily as needed.    . cyclobenzaprine (FLEXERIL) 10 MG tablet Take 1 tablet (10 mg total) by mouth 3 (three) times daily as needed for muscle spasms. 20 tablet 0   No current facility-administered medications for this visit.     Allergies  Allergen Reactions  . Latex Shortness Of Breath, Itching and Rash  . Sulfa Antibiotics Anaphylaxis  . Morphine And Related Other (See Comments)    TACHYCARDIA AND FEVER    Family History  Problem Relation Age of Onset  .  Anesthesia problems Mother     post-op N/V  . Arthritis Mother   . Hyperlipidemia Mother   . Hypertension Mother   . Arthritis Other     all 4 of his grandparents  . Hyperlipidemia Other     all grandparents  . Hypertension Maternal Grandmother   . Hypertension Paternal Grandfather   . Diabetes Maternal Grandfather     Social History   Social History  . Marital status: Married    Spouse name: N/A  . Number of children: N/A  . Years of education: N/A   Occupational History  . Not on file.   Social History Main Topics  . Smoking status: Never Smoker  . Smokeless tobacco: Former Neurosurgeon     Comment: no smokeless tobacco since age 73  . Alcohol use 0.0 oz/week     Comment: rare  . Drug use: No  . Sexual activity: Yes   Other Topics Concern  . Not on file   Social History Narrative  . No narrative on file     Constitutional: Denies fever, malaise, fatigue, headache or abrupt weight changes.  Gastrointestinal: Denies abdominal pain, bloating, constipation, diarrhea or blood in the stool.  GU: Denies  urgency, frequency, pain with urination, burning sensation, blood in urine, odor or discharge. Musculoskeletal: Pt reports back pain. Denies  difficulty with gait, or joint pain and swelling.   Neurological: Denies dizziness, difficulty with memory, difficulty with speech or problems with balance and coordination.    No other specific complaints in a complete review of systems (except as listed in HPI above).  Objective:   Physical Exam   BP 124/86   Pulse 75   Temp 98.6 F (37 C) (Oral)   Wt 294 lb (133.4 kg)   SpO2 98%   BMI 36.26 kg/m  Wt Readings from Last 3 Encounters:  02/28/16 294 lb (133.4 kg)  10/06/15 285 lb 4 oz (129.4 kg)  01/27/15 286 lb (129.7 kg)    General: Appears his stated age, obese in NAD. Musculoskeletal: Decreased flexion and rotation to the right. Normal extension and rotation to the left. No bony tenderness noted over the spine. Pain  with palpation of the left paralumbar muscles. Strength 5/5 BLE. Gait slow but steady. Neurological: Alert and oriented. Sensation intact to BLE.        Assessment & Plan:   Muscle Strain of left lower back:  Continue Ibuprofen and heat Stretching exercises given eRx for Flexeril 10 mg TID prn Advised him that this will improve with time Return precautions discussed  RTC as needed or if symptoms persist or worsen

## 2016-05-05 ENCOUNTER — Encounter: Payer: Self-pay | Admitting: Emergency Medicine

## 2016-05-05 ENCOUNTER — Emergency Department: Payer: 59

## 2016-05-05 ENCOUNTER — Emergency Department
Admission: EM | Admit: 2016-05-05 | Discharge: 2016-05-05 | Disposition: A | Discharge status: Home / Self Care | Payer: 59 | Attending: Emergency Medicine | Admitting: Emergency Medicine

## 2016-05-05 DIAGNOSIS — Z791 Long term (current) use of non-steroidal anti-inflammatories (NSAID): Secondary | ICD-10-CM | POA: Diagnosis not present

## 2016-05-05 DIAGNOSIS — Y929 Unspecified place or not applicable: Secondary | ICD-10-CM | POA: Diagnosis not present

## 2016-05-05 DIAGNOSIS — W230XXA Caught, crushed, jammed, or pinched between moving objects, initial encounter: Secondary | ICD-10-CM | POA: Diagnosis not present

## 2016-05-05 DIAGNOSIS — S60011A Contusion of right thumb without damage to nail, initial encounter: Secondary | ICD-10-CM | POA: Insufficient documentation

## 2016-05-05 DIAGNOSIS — Y999 Unspecified external cause status: Secondary | ICD-10-CM | POA: Insufficient documentation

## 2016-05-05 DIAGNOSIS — Z87891 Personal history of nicotine dependence: Secondary | ICD-10-CM | POA: Insufficient documentation

## 2016-05-05 DIAGNOSIS — Z91040 Latex allergy status: Secondary | ICD-10-CM | POA: Diagnosis not present

## 2016-05-05 DIAGNOSIS — Y939 Activity, unspecified: Secondary | ICD-10-CM | POA: Diagnosis not present

## 2016-05-05 DIAGNOSIS — S6991XA Unspecified injury of right wrist, hand and finger(s), initial encounter: Secondary | ICD-10-CM | POA: Diagnosis present

## 2016-05-05 MED ORDER — KETOROLAC TROMETHAMINE 10 MG PO TABS: 10.0000 mg | ORAL_TABLET | Freq: Four times a day (QID) | ORAL | 0 refills | Status: AC | PRN

## 2016-05-05 NOTE — ED Provider Notes (Signed)
[facility] Emergency Department Provider Note  ____________________________________________  Time seen: Approximately 5:54 PM  I have reviewed the triage vital signs and the nursing notes.   HISTORY  Chief Complaint Finger Injury    HPI Ethan Gomez is a 30 y.o. male , NAD, presents to emergency part with 90 minute history of right thumb pain. Patient states his left thumb got caught between a case of water and a shopping cart. States he started swelling about the base of the right thumb. Has decreased range of motion of the thumb due to pain. Denies any numbness, weakness, tingling. Has not noted any open wounds or lacerations. Denies any other injuries.   Past Medical History:  Diagnosis Date  . Acid reflux    occasional - Zantac as needed; none in 6 mos.  . Calcaneal spur of left foot 12/2014  . Family history of adverse reaction to anesthesia    pt's mother has hx. of post-op N/V  . Migraines   . Rupture of tendon 12/2014   left ankle and foot    Patient Active Problem List   Diagnosis Date Noted  . Frequent headaches 10/06/2015  . DDD (degenerative disc disease), lumbar 10/06/2015  . 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 BONE EXOSTOSIS EXCISION Left 01/27/2015   Procedure: EXCISION LEFT HEEL OS CALCANEAL SPUR ;  Surgeon: Loreta Ave, MD;  Location: [facility];  Service: Orthopedics;  Laterality: Left;  ANESTHESIA: GENERAL, BLOCK  . NO PAST SURGERIES    . TENDON REPAIR Left 01/27/2015   Procedure: REPAIR LEFT PERONEUS LONGUS TENDON;  Surgeon: Loreta Ave, MD;  Location:  SUR[facility]ERY [facility];  Service: Orthopedics;  Laterality: Left;    Prior to Admission medications   Medication Sig Start Date End Date Taking? Authorizing Provider  baclofen (LIORESAL) 10 MG tablet Take 10 mg by mouth as needed.    Historical Provider, MD   cyclobenzaprine (FLEXERIL) 10 MG tablet Take 1 tablet (10 mg total) by mouth 3 (three) times daily as needed for muscle spasms. 02/28/16   Lorre Munroe, NP  diclofenac (CATAFLAM) 50 MG tablet Take 1 tablet by mouth 3 (three) times daily. 07/28/15   Historical Provider, MD  ketorolac (TORADOL) 10 MG tablet Take 1 tablet (10 mg total) by mouth every 6 (six) hours as needed for severe pain. Please take with food and may not take for longer than 5 days. Do not take with any other NSAIDS. May take Tylenol only while on this medication. 05/05/16 05/10/16  Memphis Creswell L Juletta Berhe, PA-C  Multiple Vitamin (MULTIVITAMIN) tablet Take 1 tablet by mouth daily.    Historical Provider, MD  topiramate (TOPAMAX) 100 MG tablet Take 100 mg by mouth daily.    Historical Provider, MD  traMADol (ULTRAM) 50 MG tablet Take 1 tablet by mouth daily as needed. 04/08/15   Historical Provider, MD    Allergies Latex; Sulfa antibiotics; and Morphine and related  Family History  Problem Relation Age of Onset  . Anesthesia problems Mother     post-op N/V  . Arthritis Mother   . Hyperlipidemia Mother   . Hypertension Mother   . Arthritis Other     all 4 of his grandparents  . Hyperlipidemia Other     all grandparents  . Hypertension Maternal Grandmother   . Hypertension Paternal Grandfather   . Diabetes Maternal Grandfather  Social History Social History  Substance Use Topics  . Smoking status: Never Smoker  . Smokeless tobacco: Former NeurosurgeonUser     Comment: no smokeless tobacco since age 30  . Alcohol use 0.0 oz/week     Comment: rare     Review of Systems  Constitutional: No fatigue Musculoskeletal: Positive right thumb pain. Negative right hand wrist pain. Skin: Positive swelling right thumb. Negative for rash, redness, abnormal warmth, bruising, open wounds or lacerations. Neurological: Negative for Numbness, weakness, tingling.   ____________________________________________   PHYSICAL EXAM:  VITAL  SIGNS: ED Triage Vitals  Enc Vitals Group     BP 05/05/16 1657 127/78     Pulse Rate 05/05/16 1657 63     Resp 05/05/16 1657 18     Temp 05/05/16 1657 98.1 F (36.7 C)     Temp Source 05/05/16 1657 Oral     SpO2 05/05/16 1657 100 %     Weight 05/05/16 1658 289 lb (131.1 kg)     Height 05/05/16 1658 6\' 6"  (1.981 m)     Head Circumference --      Peak Flow --      Pain Score 05/05/16 1658 7     Pain Loc --      Pain Edu? --      Excl. in GC? --      Constitutional: Alert and oriented. Well appearing and in no acute distress. Eyes: Conjunctivae are normal Head: Atraumatic. Cardiovascular: Good peripheral circulation with 2+ pulses noted in the right upper extremity. Capillary refill is brisk in all digits of the right hand. Respiratory: Normal respiratory effort without tachypnea or retractions.  Musculoskeletal: Tenderness to palpation about the MCP of the right thumb without crepitus, bony abnormality or deformity. No crepitus with manipulation of the MCP. Neurologic:  Normal speech and language. No gross focal neurologic deficits are appreciated. Sensation to light touch grossly intact about the right upper extremity. Skin:  Swelling noted about the MCP of the right thumb without ecchymosis, redness, abnormal warmth, open wounds or lacerations. Skin is warm, dry and intact. No rash noted. Psychiatric: Mood and affect are normal. Speech and behavior are normal. Patient exhibits appropriate insight and judgement.   ____________________________________________   LABS  None ____________________________________________  EKG  None ____________________________________________  RADIOLOGY I, Hope PigeonJami L Olof Marcil, personally viewed and evaluated these images (plain radiographs) as part of my medical decision making, as well as reviewing the written report by the radiologist.  Dg Finger Thumb Right  Result Date: 05/05/2016 CLINICAL DATA:  Pain at the base of the right thumb after  hitting hand against a machine. Swelling. EXAM: RIGHT THUMB 2+V COMPARISON:  None. FINDINGS: There is no evidence of fracture or dislocation. There is no evidence of arthropathy or other focal bone abnormality. Soft tissues are unremarkable IMPRESSION: Negative. Electronically Signed   By: Kennith CenterEric  Mansell M.D.   On: 05/05/2016 17:30    ____________________________________________    PROCEDURES  Procedure(s) performed: None   Procedures   Medications - No data to display   ____________________________________________   INITIAL IMPRESSION / ASSESSMENT AND PLAN / ED COURSE  Pertinent labs & imaging results that were available during my care of the patient were reviewed by me and considered in my medical decision making (see chart for details).  Clinical Course as of May 05 1802  Sat May 05, 2016  1755 Patient was offered ibuprofen, Naprosyn, Tylenol as well as IM Toradol but he declined all stating that these medications did  not work for him and noted that he was afraid of needles. Patient did agree to prescription for oral Toradol to decrease pain and swelling.  [JH]    Clinical Course User Index [JH] Ulmer Degen L Olivine Hiers, PA-C    Patient's diagnosis is consistent with Contusion to right thumb without damage to nail. Patient will be discharged home with prescriptions for Toradol to take as directed. Patient is advised not to take any other NSAIDs over-the-counter while taking Toradol. May take Tylenol in conjunction as needed for pain. Patient is to follow up with Dr. Joice LoftsPoggi in orthopedics in 1 week if symptoms persist past this treatment course. Patient is given ED precautions to return to the ED for any worsening or new symptoms.    ____________________________________________  FINAL CLINICAL IMPRESSION(S) / ED DIAGNOSES  Final diagnoses:  Contusion of right thumb without damage to nail, initial encounter      NEW MEDICATIONS STARTED DURING THIS VISIT:  New Prescriptions    KETOROLAC (TORADOL) 10 MG TABLET    Take 1 tablet (10 mg total) by mouth every 6 (six) hours as needed for severe pain. Please take with food and may not take for longer than 5 days. Do not take with any other NSAIDS. May take Tylenol only while on this medication.         Hope PigeonJami L Nancee Brownrigg, PA-C 05/05/16 1803    Nita Sicklearolina Veronese, MD 05/05/16 2030

## 2016-05-05 NOTE — ED Triage Notes (Signed)
Smashed thumb approx 30 min ago, R thumb deformed position however tip pink with 2 sec cap refill

## 2016-05-23 ENCOUNTER — Encounter: Payer: Self-pay | Admitting: Internal Medicine

## 2016-05-23 ENCOUNTER — Ambulatory Visit (INDEPENDENT_AMBULATORY_CARE_PROVIDER_SITE_OTHER): Payer: 59 | Admitting: Internal Medicine

## 2016-05-23 VITALS — BP 122/68 | Temp 99.2°F | Wt 298.0 lb

## 2016-05-23 DIAGNOSIS — M79644 Pain in right finger(s): Secondary | ICD-10-CM | POA: Diagnosis not present

## 2016-05-23 MED ORDER — NAPROXEN 500 MG PO TABS: 500.0000 mg | ORAL_TABLET | Freq: Three times a day (TID) | ORAL | 0 refills | Status: DC

## 2016-05-23 NOTE — Progress Notes (Signed)
Subjective:    Patient ID: Ethan LeschesJoshua Gomez, male    DOB: 1985/07/06, 30 y.o.   MRN: [id number]  HPI  Pt presents to the clinic today for ER follow up. He went to the ER on 05/05/16, after getting his right thumb caught in between a case of water and grocery cart. Xray of the right thumb was negative for fracture. He was given a RX for Tramadol which he took with some relief. He has also been taking Tylenol, Ibuprofen and wearing a thumb immobilizer. He reports the Tylenol and Ibuprofen do not work. He has been lifting a lot at work, and reports this is something he has to do as a part of his job. He reports slight improvement in his thumb pain.  Review of Systems  Past Medical History:  Diagnosis Date  . Acid reflux    occasional - Zantac as needed; none in 6 mos.  . Calcaneal spur of left foot 12/2014  . Family history of adverse reaction to anesthesia    pt's mother has hx. of post-op N/V  . Migraines   . Rupture of tendon 12/2014   left ankle and foot    Current Outpatient Prescriptions  Medication Sig Dispense Refill  . baclofen (LIORESAL) 10 MG tablet Take 10 mg by mouth as needed.    . Multiple Vitamin (MULTIVITAMIN) tablet Take 1 tablet by mouth daily.    Marland Kitchen. topiramate (TOPAMAX) 100 MG tablet Take 100 mg by mouth daily.    . diclofenac (CATAFLAM) 50 MG tablet Take 1 tablet by mouth 3 (three) times daily.    . naproxen (NAPROSYN) 500 MG tablet Take 1 tablet (500 mg total) by mouth 3 (three) times daily with meals. 90 tablet 0   No current facility-administered medications for this visit.     Allergies  Allergen Reactions  . Latex Shortness Of Breath, Itching and Rash  . Sulfa Antibiotics Anaphylaxis  . Morphine And Related Other (See Comments)    TACHYCARDIA AND FEVER    Family History  Problem Relation Age of Onset  . Anesthesia problems Mother     post-op N/V  . Arthritis Mother   . Hyperlipidemia Mother   . Hypertension Mother   . Arthritis Other     all 4 of his  grandparents  . Hyperlipidemia Other     all grandparents  . Hypertension Maternal Grandmother   . Hypertension Paternal Grandfather   . Diabetes Maternal Grandfather     Social History   Social History  . Marital status: Married    Spouse name: N/A  . Number of children: N/A  . Years of education: N/A   Occupational History  . Not on file.   Social History Main Topics  . Smoking status: Never Smoker  . Smokeless tobacco: Former NeurosurgeonUser     Comment: no smokeless tobacco since age 30  . Alcohol use 0.0 oz/week     Comment: rare  . Drug use: No  . Sexual activity: Yes   Other Topics Concern  . Not on file   Social History Narrative  . No narrative on file     Constitutional: Denies fever, malaise, fatigue, headache or abrupt weight changes.  Musculoskeletal: Pt reports right thumb pain. Denies decrease in range of motion, difficulty with gait, muscle pain or joint swelling.  Skin: Denies redness, rashes, lesions or ulcercations.   No other specific complaints in a complete review of systems (except as listed in HPI above).  Objective:   Physical Exam   BP 122/68   Temp 99.2 F (37.3 C) (Oral)   Wt 298 lb (135.2 kg)   SpO2 97%   BMI 34.44 kg/m  Wt Readings from Last 3 Encounters:  05/23/16 298 lb (135.2 kg)  05/05/16 289 lb (131.1 kg)  02/28/16 294 lb (133.4 kg)    General: Appears his stated age,obese in NAD. Skin: No bruising noted. Musculoskeletal: Normal flexion, extension and rotation of the right thumb. Pain with palpation over the right MCP of the thumb. No joint swelling noted.  Neurological: Alert and oriented. Sensation intact to the right thumb.     Assessment & Plan:   ER followup for thumb pain:  ER notes and imaging reviewed He wants to stop Ibuprofen and Tylenol eRx for Naproxen 500 mg TID prn with food Ok to continue immobilization  Avoid heavy lifting for the next 2 weeks if possible  RTC as needed or if symptoms persist or  worsen Leafy Motsinger, NP

## 2016-05-23 NOTE — Patient Instructions (Signed)
Finger Sprain  A finger sprain is an injury to one of the strong bands of tissue (ligaments) that connect the bones in the finger. The ligament can be stretched too much, or it can tear. A tear can be either partial or complete. The severity of the sprain depends on how much of the ligament was damaged or torn.  CAUSES  This injury is often caused by a fall or an accident. For example, if you extend your hands to catch an object or to protect yourself during a fall, the force of impact may cause the ligaments in your finger to stretch too much.  RISK FACTORS  The following factors may make you more likely to have this injury:   Playing sports that involve a greater risk of falling, such as skiing.   Playing sports that involve catching an object, such as basketball.   Having poor strength and flexibility.  SYMPTOMS  Symptoms of this condition include:   Pain at the affected finger joint, especially when bending or extending the finger.   Loss of motion in the finger.   Swelling.   Tenderness.   Bruising.  DIAGNOSIS  This condition is diagnosed with a medical history and physical exam. You may also have an X-ray of your finger to rule out a fracture or dislocation.  TREATMENT  Treatment varies depending on the severity of the sprain. If your ligament is overstretched or partially torn, treatment usually involves:   Keeping the finger in a fixed position (immobilization) for a period of time. To help you do this, your health care provider may apply a bandage, splint, or cast to keep the finger from moving until it heals. In some cases, the finger may be taped to the fingers beside it (buddy taping).   Taking medicines for pain.   Doing exercises for the finger after it has begun to heal.  If your ligament is fully torn, you may need surgery to reconnect the ligament to the bone. After surgery, a cast or splint will be applied.  HOME CARE INSTRUCTIONS  If You Have a Splint:   Wear the splint as told by  your health care provider. Remove it only as told by your health care provider.   Loosen the splint if your fingers tingle, become numb, or turn cold and blue.   Do not let your splint get wet if it is not waterproof.   Keep the splint clean.  If You Have a Cast:   Do not stick anything inside the cast to scratch your skin. Doing that increases your risk of infection.   Check the skin around the cast every day. Tell your health care provider about any concerns.   You may put lotion on dry skin around the edges of the cast. Do not put lotion on the skin underneath the cast.   Do not let your cast get wet if it is not waterproof.   Keep the cast clean.  Bathing   If your splint or cast is not waterproof, cover it with a watertight plastic bag when you take a bath or a shower.   Keep any bandages (dressings) dry until your health care provider says they can be removed.  Managing Pain, Stiffness, and Swelling   If directed, put ice on the injured area:  ? Put ice in a plastic bag.  ? Place a towel between your skin and the bag.  ? Leave the ice on for 20 minutes, 2-3 times a day.     Move your fingers often to avoid stiffness and to lessen swelling.   Raise (elevate) the injured area above the level of your heart while you are sitting or lying down.  General Instructions   Do not put pressure on any part of the cast or splint until it is fully hardened. This may take several hours.   Take over-the-counter and prescription medicines only as told by your health care provider.   Do not drive or operate heavy machinery while taking prescription pain medicine.   Do exercises as told by your health care provider or physical therapist.   Do not wear rings on your injured finger.   Keep all follow-up visits as told by your health care provider. This is important.  SEEK MEDICAL CARE IF:   Your pain is not controlled with medicine.   Your bruising or swelling gets worse.   Your cast or splint is  damaged.   Your finger is numb or blue.   Your finger feels colder than normal.  This information is not intended to replace advice given to you by your health care provider. Make sure you discuss any questions you have with your health care provider.  Document Released: 07/19/2004 Document Revised: 10/03/2015 Document Reviewed: 04/21/2015  Elsevier Interactive Patient Education  2017 [employer]

## 2016-09-13 ENCOUNTER — Other Ambulatory Visit: Payer: Self-pay | Admitting: Orthopaedic Surgery

## 2016-09-13 DIAGNOSIS — M5136 Other intervertebral disc degeneration, lumbar region: Secondary | ICD-10-CM

## 2016-09-24 ENCOUNTER — Ambulatory Visit
Admission: RE | Admit: 2016-09-24 | Discharge: 2016-09-24 | Disposition: A | Discharge status: Home / Self Care | Payer: 59 | Source: Ambulatory Visit | Attending: Orthopaedic Surgery | Admitting: Orthopaedic Surgery

## 2016-09-24 DIAGNOSIS — M5136 Other intervertebral disc degeneration, lumbar region: Secondary | ICD-10-CM

## 2017-05-02 ENCOUNTER — Ambulatory Visit: Payer: Self-pay | Admitting: *Deleted

## 2017-05-02 NOTE — Telephone Encounter (Signed)
   Reason for Disposition . [1] Unable to use arm at all AND [2] because of shoulder pain or stiffness  Answer Assessment - Initial Assessment Questions 1. ONSET: "When did the pain start?"     Yesterday- more of an ache- around shoulder blade 2. LOCATION: "Where is the pain located?"     Right side at shoulder blade in the back- feels like a knife 3. PAIN: "How bad is the pain?" (Scale 1-10; or mild, moderate, severe)   - MILD (1-3): doesn't interfere with normal activities   - MODERATE (4-7): interferes with normal activities (e.g., work or school) or awakens from sleep   - SEVERE (8-10): excruciating pain, unable to do any normal activities, unable to move arm at all due to pain     7- still 9- moving 4. WORK OR EXERCISE: "Has there been any recent work or exercise that involved this part of the body?"     Nothing specific- has child and was active- but no pain with that activity 5. CAUSE: "What do you think is causing the shoulder pain?"     unknown 6. OTHER SYMPTOMS: "Do you have any other symptoms?" (e.g., neck pain, swelling, rash, fever, numbness, weakness)     Neck was hurting- patient went to chiropractor today- neck is  better  7. PREGNANCY: "Is there any chance you are pregnant?" "When was your last menstrual period?"     n/a  Protocols used: SHOULDER PAIN-A-AH  Patient is unaware of injury to arm- has been to chiropractor today- shoulder pain is severe and no better. Advised Urgent Care.

## 2017-12-19 ENCOUNTER — Ambulatory Visit: Payer: Self-pay | Admitting: Internal Medicine

## 2019-01-20 ENCOUNTER — Ambulatory Visit: Payer: Medicaid Other | Admitting: Adult Health

## 2019-01-20 ENCOUNTER — Other Ambulatory Visit: Payer: Self-pay

## 2019-01-20 ENCOUNTER — Encounter: Payer: Self-pay | Admitting: Adult Health

## 2019-01-20 VITALS — BP 126/98 | HR 95 | Resp 16 | Ht 78.0 in | Wt 346.0 lb

## 2019-01-20 DIAGNOSIS — G43909 Migraine, unspecified, not intractable, without status migrainosus: Secondary | ICD-10-CM | POA: Diagnosis not present

## 2019-01-20 MED ORDER — EMGALITY 120 MG/ML ~~LOC~~ SOAJ: 120.0000 mg | SUBCUTANEOUS | 2 refills | Status: DC

## 2019-01-20 NOTE — Progress Notes (Signed)
[facility] [place], [place] [address]  Internal MEDICINE  Office Visit Note  Patient Name: Ethan Gomez  [id number]  [id number]  Date of Service: 01/20/2019   Complaints/HPI Pt is here for establishment of PCP. Chief Complaint  Patient presents with  . Migraine    new patient establish care    HPI Pt is here to establish care.  He is a well appearing 33 yo male. He is currently unemployed.  He is working on his teaching certificate currently. Has a history Migraine, ADD, and childhood asthma. He does not currently take medications for asthma and ADD.  Pt reports he is having about 18 migraines a month.  He has tried multiple medications in the past including topomax, immetrix and trazadone.  The only thing that helps are narcotics, and he does not want to take those.          Current Medication: Outpatient Encounter Medications as of 01/20/2019  Medication Sig Note  . [DISCONTINUED] baclofen (LIORESAL) 10 MG tablet Take 10 mg by mouth as needed.   . [DISCONTINUED] diclofenac (CATAFLAM) 50 MG tablet Take 1 tablet by mouth 3 (three) times daily. 10/06/2015: Received from: External Pharmacy Received Sig:   . [DISCONTINUED] Multiple Vitamin (MULTIVITAMIN) tablet Take 1 tablet by mouth daily.   . [DISCONTINUED] naproxen (NAPROSYN) 500 MG tablet Take 1 tablet (500 mg total) by mouth 3 (three) times daily with meals. (Patient not taking: Reported on 01/20/2019)   . [DISCONTINUED] topiramate (TOPAMAX) 100 MG tablet Take 100 mg by mouth daily.    No facility-administered encounter medications on file as of 01/20/2019.     Surgical History: Past Surgical History:  Procedure Laterality Date  . BONE EXOSTOSIS EXCISION Left 01/27/2015   Procedure: EXCISION LEFT HEEL OS CALCANEAL SPUR ;  Surgeon: Ninetta Lights, MD;  Location: [facility];  Service: Orthopedics;  Laterality: Left;  ANESTHESIA: GENERAL, BLOCK  . NO PAST SURGERIES    . TENDON REPAIR Left  01/27/2015   Procedure: REPAIR LEFT PERONEUS LONGUS TENDON;  Surgeon: Ninetta Lights, MD;  Location: [facility];  Service: Orthopedics;  Laterality: Left;    Medical History: Past Medical History:  Diagnosis Date  . Acid reflux    occasional - Zantac as needed; none in 6 mos.  . Calcaneal spur of left foot 12/2014  . Family history of adverse reaction to anesthesia    pt's mother has hx. of post-op N/V  . Migraines   . Rupture of tendon 12/2014   left ankle and foot    Family History: Family History  Problem Relation Age of Onset  . Anesthesia problems Mother        post-op N/V  . Arthritis Mother   . Hyperlipidemia Mother   . Hypertension Mother   . Arthritis Other        all 4 of his grandparents  . Hyperlipidemia Other        all grandparents  . Hypertension Maternal Grandmother   . Hypertension Paternal Grandfather   . Diabetes Maternal Grandfather     Social History   Socioeconomic History  . Marital status: Married    Spouse name: Not on file  . Number of children: Not on file  . Years of education: Not on file  . Highest education level: Not on file  Occupational History  . Not on file  Social Needs  . Financial resource strain: Not on file  . Food insecurity  Worry: Not on file    Inability: Not on file  . Transportation needs    Medical: Not on file    Non-medical: Not on file  Tobacco Use  . Smoking status: Never Smoker  . Smokeless tobacco: Former NeurosurgeonUser  . Tobacco comment: no smokeless tobacco since age 33  Substance and Sexual Activity  . Alcohol use: Yes    Alcohol/week: 0.0 standard drinks    Comment: rare  . Drug use: No  . Sexual activity: Yes  Lifestyle  . Physical activity    Days per week: Not on file    Minutes per session: Not on file  . Stress: Not on file  Relationships  . Social Musicianconnections    Talks on phone: Not on file    Gets together: Not on file    Attends religious service: Not on file    Active member  of club or organization: Not on file    Attends meetings of clubs or organizations: Not on file    Relationship status: Not on file  . Intimate partner violence    Fear of current or ex partner: Not on file    Emotionally abused: Not on file    Physically abused: Not on file    Forced sexual activity: Not on file  Other Topics Concern  . Not on file  Social History Narrative  . Not on file     Review of Systems  Constitutional: Negative.  Negative for chills, fatigue and unexpected weight change.  HENT: Negative.  Negative for congestion, rhinorrhea, sneezing and sore throat.   Eyes: Negative for redness.  Respiratory: Negative.  Negative for cough, chest tightness and shortness of breath.   Cardiovascular: Negative.  Negative for chest pain and palpitations.  Gastrointestinal: Negative.  Negative for abdominal pain, constipation, diarrhea, nausea and vomiting.  Endocrine: Negative.   Genitourinary: Negative.  Negative for dysuria and frequency.  Musculoskeletal: Negative.  Negative for arthralgias, back pain, joint swelling and neck pain.  Skin: Negative.  Negative for rash.  Allergic/Immunologic: Negative.   Neurological: Negative.  Negative for tremors and numbness.  Hematological: Negative for adenopathy. Does not bruise/bleed easily.  Psychiatric/Behavioral: Negative.  Negative for behavioral problems, sleep disturbance and suicidal ideas. The patient is not nervous/anxious.     Vital Signs: BP (!) 126/98   Pulse 95   Resp 16   Ht 6\' 6"  (1.981 m)   Wt (!) 346 lb (156.9 kg)   SpO2 98%   BMI 39.98 kg/m    Physical Exam Vitals signs and nursing note reviewed.  Constitutional:      General: He is not in acute distress.    Appearance: He is well-developed. He is not diaphoretic.  HENT:     Head: Normocephalic and atraumatic.     Mouth/Throat:     Pharynx: No oropharyngeal exudate.  Eyes:     Pupils: Pupils are equal, round, and reactive to light.  Neck:      Musculoskeletal: Normal range of motion and neck supple.     Thyroid: No thyromegaly.     Vascular: No JVD.     Trachea: No tracheal deviation.  Cardiovascular:     Rate and Rhythm: Normal rate and regular rhythm.     Heart sounds: Normal heart sounds. No murmur. No friction rub. No gallop.   Pulmonary:     Effort: Pulmonary effort is normal. No respiratory distress.     Breath sounds: Normal breath sounds. No wheezing or rales.  Chest:     Chest wall: No tenderness.  Abdominal:     Palpations: Abdomen is soft.     Tenderness: There is no abdominal tenderness. There is no guarding.  Musculoskeletal: Normal range of motion.  Lymphadenopathy:     Cervical: No cervical adenopathy.  Skin:    General: Skin is warm and dry.  Neurological:     Mental Status: He is alert and oriented to person, place, and time.     Cranial Nerves: No cranial nerve deficit.  Psychiatric:        Behavior: Behavior normal.        Thought Content: Thought content normal.        Judgment: Judgment normal.    Assessment/Plan: 1. Migraine without status migrainosus, not intractable, unspecified migraine type PT given loading dose of emaglity today, and will follow up in one month. RX sent to pharmacy to begin financial approval.  - Galcanezumab-gnlm (EMGALITY) 120 MG/ML SOAJ; Inject 120 mg into the skin every 30 (thirty) days.  Dispense: 4 pen; Refill: 2  2. Morbid obesity (HCC) Obesity Counseling: Risk Assessment: An assessment of behavioral risk factors was made today and includes lack of exercise sedentary lifestyle, lack of portion control and poor dietary habits.  Risk Modification Advice: She was counseled on portion control guidelines. Restricting daily caloric intake to. . The detrimental long term effects of obesity on her health and ongoing poor compliance was also discussed with the patient.    General Counseling: Randalyn RheaJoshua verbalizes understanding of the findings of todays visit and agrees with  plan of treatment. I have discussed any further diagnostic evaluation that may be needed or ordered today. We also reviewed his medications today. he has been encouraged to call the office with any questions or concerns that should arise related to todays visit.  No orders of the defined types were placed in this encounter.   No orders of the defined types were placed in this encounter.   Time spent: 30 Minutes   This patient was seen by Blima LedgerAdam Kysha Muralles AGNP-C in Collaboration with Dr Lyndon CodeFozia M Khan as a part of collaborative care agreement  Johnna AcostaAdam J. Jacquelyne Quarry AGNP-C Internal Medicine

## 2019-01-29 ENCOUNTER — Encounter: Payer: Self-pay | Admitting: Adult Health

## 2019-02-11 ENCOUNTER — Other Ambulatory Visit: Payer: Self-pay | Admitting: Adult Health

## 2019-02-12 ENCOUNTER — Ambulatory Visit
Admission: RE | Admit: 2019-02-12 | Discharge: 2019-02-12 | Disposition: A | Discharge status: Home / Self Care | Payer: Medicaid Other | Attending: Adult Health | Admitting: Adult Health

## 2019-02-12 ENCOUNTER — Other Ambulatory Visit: Payer: Self-pay

## 2019-02-12 ENCOUNTER — Encounter: Payer: Self-pay | Admitting: Adult Health

## 2019-02-12 ENCOUNTER — Ambulatory Visit
Admission: RE | Admit: 2019-02-12 | Discharge: 2019-02-12 | Disposition: A | Discharge status: Home / Self Care | Payer: Medicaid Other | Source: Ambulatory Visit | Attending: Adult Health | Admitting: Adult Health

## 2019-02-12 ENCOUNTER — Ambulatory Visit: Payer: Medicaid Other | Admitting: Adult Health

## 2019-02-12 VITALS — BP 121/89 | HR 70 | Temp 98.7°F | Resp 16 | Ht 78.0 in | Wt 348.0 lb

## 2019-02-12 DIAGNOSIS — M79602 Pain in left arm: Secondary | ICD-10-CM

## 2019-02-12 LAB — CBC WITH DIFFERENTIAL/PLATELET
Basophils Absolute: 0 10*3/uL (ref 0.0–0.2)
Basos: 1 %
EOS (ABSOLUTE): 0.2 10*3/uL (ref 0.0–0.4)
Eos: 3 %
Hematocrit: 42.2 % (ref 37.5–51.0)
Hemoglobin: 14.2 g/dL (ref 13.0–17.7)
Immature Grans (Abs): 0 10*3/uL (ref 0.0–0.1)
Immature Granulocytes: 0 %
Lymphocytes Absolute: 2.4 10*3/uL (ref 0.7–3.1)
Lymphs: 33 %
MCH: 29.3 pg (ref 26.6–33.0)
MCHC: 33.6 g/dL (ref 31.5–35.7)
MCV: 87 fL (ref 79–97)
Monocytes Absolute: 0.6 10*3/uL (ref 0.1–0.9)
Monocytes: 9 %
Neutrophils Absolute: 3.9 10*3/uL (ref 1.4–7.0)
Neutrophils: 54 %
Platelets: 330 10*3/uL (ref 150–450)
RBC: 4.84 x10E6/uL (ref 4.14–5.80)
RDW: 12 % (ref 11.6–15.4)
WBC: 7.2 10*3/uL (ref 3.4–10.8)

## 2019-02-12 LAB — COMPREHENSIVE METABOLIC PANEL
ALT: 26 IU/L (ref 0–44)
AST: 19 IU/L (ref 0–40)
Albumin/Globulin Ratio: 1.6 (ref 1.2–2.2)
Albumin: 4.4 g/dL (ref 4.0–5.0)
Alkaline Phosphatase: 67 IU/L (ref 39–117)
BUN/Creatinine Ratio: 15 (ref 9–20)
BUN: 11 mg/dL (ref 6–20)
Bilirubin Total: 0.3 mg/dL (ref 0.0–1.2)
CO2: 23 mmol/L (ref 20–29)
Calcium: 9.4 mg/dL (ref 8.7–10.2)
Chloride: 103 mmol/L (ref 96–106)
Creatinine, Ser: 0.73 mg/dL — ABNORMAL LOW (ref 0.76–1.27)
GFR calc Af Amer: 143 mL/min/{1.73_m2} (ref >59)
GFR calc non Af Amer: 124 mL/min/{1.73_m2} (ref >59)
Globulin, Total: 2.7 g/dL (ref 1.5–4.5)
Glucose: 87 mg/dL (ref 65–99)
Potassium: 4.4 mmol/L (ref 3.5–5.2)
Sodium: 141 mmol/L (ref 134–144)
Total Protein: 7.1 g/dL (ref 6.0–8.5)

## 2019-02-12 LAB — LIPID PANEL WITH LDL/HDL RATIO
Cholesterol, Total: 255 mg/dL — ABNORMAL HIGH (ref 100–199)
HDL: 44 mg/dL (ref >39)
LDL Calculated: 166 mg/dL — ABNORMAL HIGH (ref 0–99)
LDl/HDL Ratio: 3.8 ratio — ABNORMAL HIGH (ref 0.0–3.6)
Triglycerides: 226 mg/dL — ABNORMAL HIGH (ref 0–149)
VLDL Cholesterol Cal: 45 mg/dL — ABNORMAL HIGH (ref 5–40)

## 2019-02-12 LAB — TSH: TSH: 2.1 u[IU]/mL (ref 0.450–4.500)

## 2019-02-12 LAB — HGB A1C W/O EAG: Hgb A1c MFr Bld: 5.4 % (ref 4.8–5.6)

## 2019-02-12 LAB — T4, FREE: Free T4: 1.09 ng/dL (ref 0.82–1.77)

## 2019-02-12 MED ORDER — TRAMADOL HCL 50 MG PO TABS: 50.0000 mg | ORAL_TABLET | Freq: Four times a day (QID) | ORAL | 0 refills | Status: DC | PRN

## 2019-02-12 NOTE — Progress Notes (Signed)
[facility] [place], [place] [address]  Internal MEDICINE  Office Visit Note  Patient Name: Ethan Gomez  [id number]  [id number]  Date of Service: 02/12/2019  Chief Complaint  Patient presents with  . Elbow Injury    slipped and fell 3 days ago and fell onto elbow feels pulling sensation when bending      HPI Pt is here for a sick visit. Pt reports he slipped on a ramp. And landed on his left elbow. Is able to move the extremity however has pain above the elbow that he describes as a "pulling" sensation.  He has some minor swelling. He has good movement of his fingers, and elbow, however has pain.     Current Medication:  Outpatient Encounter Medications as of 02/12/2019  Medication Sig  . Galcanezumab-gnlm (EMGALITY) 120 MG/ML SOAJ Inject 120 mg into the skin every 30 (thirty) days.  . traMADol (ULTRAM) 50 MG tablet Take 1-2 tablets (50-100 mg total) by mouth every 6 (six) hours as needed.   No facility-administered encounter medications on file as of 02/12/2019.       Medical History: Past Medical History:  Diagnosis Date  . Acid reflux    occasional - Zantac as needed; none in 6 mos.  . Calcaneal spur of left foot 12/2014  . Family history of adverse reaction to anesthesia    pt's mother has hx. of post-op N/V  . Migraines   . Rupture of tendon 12/2014   left ankle and foot     Vital Signs: BP 121/89   Pulse 70   Temp 98.7 F (37.1 C) (Oral)   Resp 16   Ht 6\' 6"  (1.981 m)   Wt (!) 348 lb (157.9 kg)   SpO2 98%   BMI 40.22 kg/m    Review of Systems  Constitutional: Negative.  Negative for chills, fatigue and unexpected weight change.  HENT: Negative.  Negative for congestion, rhinorrhea, sneezing and sore throat.   Eyes: Negative for redness.  Respiratory: Negative.  Negative for cough, chest tightness and shortness of breath.   Cardiovascular: Negative.  Negative for chest pain and palpitations.  Gastrointestinal: Negative.   Negative for abdominal pain, constipation, diarrhea, nausea and vomiting.  Endocrine: Negative.   Genitourinary: Negative.  Negative for dysuria and frequency.  Musculoskeletal: Negative.  Negative for arthralgias, back pain, joint swelling and neck pain.  Skin: Negative.  Negative for rash.  Allergic/Immunologic: Negative.   Neurological: Negative.  Negative for tremors and numbness.  Hematological: Negative for adenopathy. Does not bruise/bleed easily.  Psychiatric/Behavioral: Negative.  Negative for behavioral problems, sleep disturbance and suicidal ideas. The patient is not nervous/anxious.     Physical Exam Vitals signs and nursing note reviewed.  Constitutional:      General: He is not in acute distress.    Appearance: He is well-developed. He is not diaphoretic.  HENT:     Head: Normocephalic and atraumatic.     Mouth/Throat:     Pharynx: No oropharyngeal exudate.  Eyes:     Pupils: Pupils are equal, round, and reactive to light.  Neck:     Musculoskeletal: Normal range of motion and neck supple.     Thyroid: No thyromegaly.     Vascular: No JVD.     Trachea: No tracheal deviation.  Cardiovascular:     Rate and Rhythm: Normal rate and regular rhythm.     Heart sounds: Normal heart sounds. No murmur. No friction rub. No gallop.  Pulmonary:     Effort: Pulmonary effort is normal. No respiratory distress.     Breath sounds: Normal breath sounds. No wheezing or rales.  Chest:     Chest wall: No tenderness.  Abdominal:     Palpations: Abdomen is soft.     Tenderness: There is no abdominal tenderness. There is no guarding.  Musculoskeletal: Normal range of motion.  Lymphadenopathy:     Cervical: No cervical adenopathy.  Skin:    General: Skin is warm and dry.  Neurological:     Mental Status: He is alert and oriented to person, place, and time.     Cranial Nerves: No cranial nerve deficit.  Psychiatric:        Behavior: Behavior normal.        Thought Content:  Thought content normal.        Judgment: Judgment normal.    Assessment/Plan: 1. Left arm pain Will get X-ray, and follow up with results.  Use tramadol to help with sleep.   - DG Elbow Complete Left; Future - traMADol (ULTRAM) 50 MG tablet; Take 1-2 tablets (50-100 mg total) by mouth every 6 (six) hours as needed.  Dispense: 30 tablet; Refill: 0  2. Morbid obesity (HCC) Obesity Counseling: Risk Assessment: An assessment of behavioral risk factors was made today and includes lack of exercise sedentary lifestyle, lack of portion control and poor dietary habits.  Risk Modification Advice: She was counseled on portion control guidelines. Restricting daily caloric intake to. . The detrimental long term effects of obesity on her health and ongoing poor compliance was also discussed with the patient.    General Counseling: Ethan RheaJoshua verbalizes understanding of the findings of todays visit and agrees with plan of treatment. I have discussed any further diagnostic evaluation that may be needed or ordered today. We also reviewed his medications today. he has been encouraged to call the office with any questions or concerns that should arise related to todays visit.   Orders Placed This Encounter  Procedures  . DG Elbow Complete Left    Meds ordered this encounter  Medications  . traMADol (ULTRAM) 50 MG tablet    Sig: Take 1-2 tablets (50-100 mg total) by mouth every 6 (six) hours as needed.    Dispense:  30 tablet    Refill:  0    Time spent: 15 Minutes  This patient was seen by Blima LedgerAdam Destynie Toomey AGNP-C in Collaboration with Dr Lyndon CodeFozia M Khan as a part of collaborative care agreement.  Johnna AcostaAdam J. German Manke AGNP-C Internal Medicine

## 2019-02-16 ENCOUNTER — Encounter: Payer: Medicaid Other | Admitting: Adult Health

## 2019-02-19 ENCOUNTER — Other Ambulatory Visit: Payer: Self-pay | Admitting: Adult Health

## 2019-02-19 DIAGNOSIS — M79602 Pain in left arm: Secondary | ICD-10-CM

## 2019-02-23 ENCOUNTER — Other Ambulatory Visit: Payer: Self-pay | Admitting: Adult Health

## 2019-02-23 ENCOUNTER — Telehealth: Payer: Self-pay

## 2019-02-23 DIAGNOSIS — M79602 Pain in left arm: Secondary | ICD-10-CM

## 2019-02-23 MED ORDER — TRAMADOL HCL 50 MG PO TABS: 50.0000 mg | ORAL_TABLET | Freq: Four times a day (QID) | ORAL | 0 refills | Status: DC | PRN

## 2019-02-23 NOTE — Progress Notes (Signed)
Refilled patients tramadol  

## 2019-02-24 NOTE — Telephone Encounter (Signed)
lmom we send med to your phar

## 2019-03-03 ENCOUNTER — Ambulatory Visit: Payer: Medicaid Other | Admitting: Adult Health

## 2019-03-03 ENCOUNTER — Other Ambulatory Visit: Payer: Self-pay

## 2019-03-03 ENCOUNTER — Encounter: Payer: Self-pay | Admitting: Adult Health

## 2019-03-03 VITALS — BP 116/86 | HR 86 | Resp 16 | Ht 78.0 in | Wt 348.0 lb

## 2019-03-03 DIAGNOSIS — R300 Dysuria: Secondary | ICD-10-CM | POA: Diagnosis not present

## 2019-03-03 DIAGNOSIS — Z0001 Encounter for general adult medical examination with abnormal findings: Secondary | ICD-10-CM | POA: Diagnosis not present

## 2019-03-03 DIAGNOSIS — M79602 Pain in left arm: Secondary | ICD-10-CM

## 2019-03-03 DIAGNOSIS — G43909 Migraine, unspecified, not intractable, without status migrainosus: Secondary | ICD-10-CM | POA: Diagnosis not present

## 2019-03-03 MED ORDER — EMGALITY 120 MG/ML ~~LOC~~ SOAJ: 120.0000 mg | SUBCUTANEOUS | 4 refills | Status: DC

## 2019-03-03 MED ORDER — METHOCARBAMOL 750 MG PO TABS: 750.0000 mg | ORAL_TABLET | Freq: Every day | ORAL | 0 refills | Status: DC | PRN

## 2019-03-03 NOTE — Progress Notes (Signed)
[facility] [address] [place], [place] [address]  Internal MEDICINE  Office Visit Note  Patient Name: Ethan LeschesJoshua Gomez  [id number]  [id number]  Date of Service: 03/03/2019  Chief Complaint  Patient presents with  . Medical Management of Chronic Issues    4 week follow up , review labs and xray   . Annual Exam  . Elbow Injury    doing a little better , exteded and rotating still bit painful      HPI Pt is here for routine health maintenance examination.  He is a well appearing  33 yo obese male.  He has a history of migraines which are improved with emgality. He will continue emgality at this time.  He also has a lingering elbow injury that he reports is slowly improving.  He denies tobacco, alcohol, or illicit drug use.      Current Medication: Outpatient Encounter Medications as of 03/03/2019  Medication Sig  . Galcanezumab-gnlm (EMGALITY) 120 MG/ML SOAJ Inject 120 mg into the skin every 30 (thirty) days.  . traMADol (ULTRAM) 50 MG tablet Take 1-2 tablets (50-100 mg total) by mouth every 6 (six) hours as needed.   No facility-administered encounter medications on file as of 03/03/2019.     Surgical History: Past Surgical History:  Procedure Laterality Date  . BONE EXOSTOSIS EXCISION Left 01/27/2015   Procedure: EXCISION LEFT HEEL OS CALCANEAL SPUR ;  Surgeon: Loreta Aveaniel F Murphy, MD;  Location: [facility];  Service: Orthopedics;  Laterality: Left;  ANESTHESIA: GENERAL, BLOCK  . NO PAST SURGERIES    . TENDON REPAIR Left 01/27/2015   Procedure: REPAIR LEFT PERONEUS LONGUS TENDON;  Surgeon: Loreta Aveaniel F Murphy, MD;  Location: [facility];  Service: Orthopedics;  Laterality: Left;    Medical History: Past Medical History:  Diagnosis Date  . Acid reflux    occasional - Zantac as needed; none in 6 mos.  . Calcaneal spur of left foot 12/2014  . Family history of adverse reaction to anesthesia    pt's mother has hx. of post-op N/V  . Migraines   .  Rupture of tendon 12/2014   left ankle and foot    Family History: Family History  Problem Relation Age of Onset  . Anesthesia problems Mother        post-op N/V  . Arthritis Mother   . Hyperlipidemia Mother   . Hypertension Mother   . Arthritis Other        all 4 of his grandparents  . Hyperlipidemia Other        all grandparents  . Hypertension Maternal Grandmother   . Hypertension Paternal Grandfather   . Diabetes Maternal Grandfather       Review of Systems  Constitutional: Negative.  Negative for chills, fatigue and unexpected weight change.  HENT: Negative.  Negative for congestion, rhinorrhea, sneezing and sore throat.   Eyes: Negative for redness.  Respiratory: Negative.  Negative for cough, chest tightness and shortness of breath.   Cardiovascular: Negative.  Negative for chest pain and palpitations.  Gastrointestinal: Negative.  Negative for abdominal pain, constipation, diarrhea, nausea and vomiting.  Endocrine: Negative.   Genitourinary: Negative.  Negative for dysuria and frequency.  Musculoskeletal: Negative.  Negative for arthralgias, back pain, joint swelling and neck pain.  Skin: Negative.  Negative for rash.  Allergic/Immunologic: Negative.   Neurological: Negative.  Negative for tremors and numbness.  Hematological: Negative for adenopathy. Does not bruise/bleed easily.  Psychiatric/Behavioral: Negative.  Negative for  behavioral problems, sleep disturbance and suicidal ideas. The patient is not nervous/anxious.      Vital Signs: BP 116/86   Pulse 86   Resp 16   Ht 6\' 6"  (1.981 m)   Wt (!) 348 lb (157.9 kg)   SpO2 98%   BMI 40.22 kg/m    Physical Exam Vitals signs and nursing note reviewed.  Constitutional:      General: He is not in acute distress.    Appearance: He is well-developed. He is not diaphoretic.  HENT:     Head: Normocephalic and atraumatic.     Mouth/Throat:     Pharynx: No oropharyngeal exudate.  Eyes:     Pupils: Pupils  are equal, round, and reactive to light.  Neck:     Musculoskeletal: Normal range of motion and neck supple.     Thyroid: No thyromegaly.     Vascular: No JVD.     Trachea: No tracheal deviation.  Cardiovascular:     Rate and Rhythm: Normal rate and regular rhythm.     Heart sounds: Normal heart sounds. No murmur. No friction rub. No gallop.   Pulmonary:     Effort: Pulmonary effort is normal. No respiratory distress.     Breath sounds: Normal breath sounds. No wheezing or rales.  Chest:     Chest wall: No tenderness.  Abdominal:     Palpations: Abdomen is soft.     Tenderness: There is no abdominal tenderness. There is no guarding.     Hernia: There is no hernia in the left inguinal area or right inguinal area.  Genitourinary:    Penis: Circumcised. No phimosis, paraphimosis, hypospadias, erythema, tenderness, discharge, swelling or lesions.      Scrotum/Testes: Normal. Cremasteric reflex is present.        Right: Mass, tenderness or swelling not present.        Left: Mass, tenderness or swelling not present.     Epididymis:     Right: Normal.     Left: Normal.  Musculoskeletal: Normal range of motion.  Lymphadenopathy:     Cervical: No cervical adenopathy.     Lower Body: No right inguinal adenopathy. No left inguinal adenopathy.  Skin:    General: Skin is warm and dry.  Neurological:     Mental Status: He is alert and oriented to person, place, and time.     Cranial Nerves: No cranial nerve deficit.  Psychiatric:        Behavior: Behavior normal.        Thought Content: Thought content normal.        Judgment: Judgment normal.      LABS: Recent Results (from the past 2160 hour(s))  CBC with Differential/Platelet     Status: None   Collection Time: 02/11/19  9:19 AM  Result Value Ref Range   WBC 7.2 3.4 - 10.8 x10E3/uL   RBC 4.84 4.14 - 5.80 x10E6/uL   Hemoglobin 14.2 13.0 - 17.7 g/dL   Hematocrit 40.942.2 81.137.5 - 51.0 %   MCV 87 79 - 97 fL   MCH 29.3 26.6 - 33.0 pg    MCHC 33.6 31.5 - 35.7 g/dL   RDW 91.412.0 78.211.6 - 95.615.4 %   Platelets 330 150 - 450 x10E3/uL   Neutrophils 54 Not Estab. %   Lymphs 33 Not Estab. %   Monocytes 9 Not Estab. %   Eos 3 Not Estab. %   Basos 1 Not Estab. %   Neutrophils Absolute 3.9 1.4 -  7.0 x10E3/uL   Lymphocytes Absolute 2.4 0.7 - 3.1 x10E3/uL   Monocytes Absolute 0.6 0.1 - 0.9 x10E3/uL   EOS (ABSOLUTE) 0.2 0.0 - 0.4 x10E3/uL   Basophils Absolute 0.0 0.0 - 0.2 x10E3/uL   Immature Granulocytes 0 Not Estab. %   Immature Grans (Abs) 0.0 0.0 - 0.1 x10E3/uL  Comprehensive metabolic panel     Status: Abnormal   Collection Time: 02/11/19  9:19 AM  Result Value Ref Range   Glucose 87 65 - 99 mg/dL   BUN 11 6 - 20 mg/dL   Creatinine, Ser 6.46 (L) 0.76 - 1.27 mg/dL   GFR calc non Af Amer 124 >59 mL/min/1.73   GFR calc Af Amer 143 >59 mL/min/1.73   BUN/Creatinine Ratio 15 9 - 20   Sodium 141 134 - 144 mmol/L   Potassium 4.4 3.5 - 5.2 mmol/L   Chloride 103 96 - 106 mmol/L   CO2 23 20 - 29 mmol/L   Calcium 9.4 8.7 - 10.2 mg/dL   Total Protein 7.1 6.0 - 8.5 g/dL   Albumin 4.4 4.0 - 5.0 g/dL   Globulin, Total 2.7 1.5 - 4.5 g/dL   Albumin/Globulin Ratio 1.6 1.2 - 2.2   Bilirubin Total 0.3 0.0 - 1.2 mg/dL   Alkaline Phosphatase 67 39 - 117 IU/L   AST 19 0 - 40 IU/L   ALT 26 0 - 44 IU/L  Lipid Panel With LDL/HDL Ratio     Status: Abnormal   Collection Time: 02/11/19  9:19 AM  Result Value Ref Range   Cholesterol, Total 255 (H) 100 - 199 mg/dL   Triglycerides 803 (H) 0 - 149 mg/dL   HDL 44 >21 mg/dL   VLDL Cholesterol Cal 45 (H) 5 - 40 mg/dL   LDL Calculated 224 (H) 0 - 99 mg/dL   LDl/HDL Ratio 3.8 (H) 0.0 - 3.6 ratio    Comment:                                     LDL/HDL Ratio                                             Men  Women                               1/2 Avg.Risk  1.0    1.5                                   Avg.Risk  3.6    3.2                                2X Avg.Risk  6.2    5.0                                 3X Avg.Risk  8.0    6.1   Hgb A1c w/o eAG     Status: None   Collection Time: 02/11/19  9:19 AM  Result Value Ref Range   Hgb A1c MFr Bld 5.4  4.8 - 5.6 %    Comment:          Prediabetes: 5.7 - 6.4          Diabetes: >6.4          Glycemic control for adults with diabetes: <7.0   T4, free     Status: None   Collection Time: 02/11/19  9:19 AM  Result Value Ref Range   Free T4 1.09 0.82 - 1.77 ng/dL  TSH     Status: None   Collection Time: 02/11/19  9:19 AM  Result Value Ref Range   TSH 2.100 0.450 - 4.500 uIU/mL     Assessment/Plan: 1. Encounter for general adult medical examination with abnormal findings Up to date on PHM, labs done.   2. Migraine without status migrainosus, not intractable, unspecified migraine type Continue to use Emgality as directed.  Use Robaxin for breath through pain, and  - Galcanezumab-gnlm (EMGALITY) 120 MG/ML SOAJ; Inject 120 mg into the skin every 30 (thirty) days.  Dispense: 4 pen; Refill: 4 - methocarbamol (ROBAXIN) 750 MG tablet; Take 1 tablet (750 mg total) by mouth daily as needed for muscle spasms.  Dispense: 20 tablet; Refill: 0  3. Left arm pain Improving at this time, continue follow.    4. Dysuria - UA/M w/rflx Culture, Routine  General Counseling: Ethan Gomez verbalizes understanding of the findings of todays visit and agrees with plan of treatment. I have discussed any further diagnostic evaluation that may be needed or ordered today. We also reviewed his medications today. he has been encouraged to call the office with any questions or concerns that should arise related to todays visit.   Orders Placed This Encounter  Procedures  . UA/M w/rflx Culture, Routine    No orders of the defined types were placed in this encounter.   Time spent: 35 Minutes   This patient was seen by Orson Gear AGNP-C in Collaboration with Dr Lavera Guise as a part of collaborative care agreement    Kendell Bane AGNP-C Internal Medicine

## 2019-03-04 LAB — MICROSCOPIC EXAMINATION
Bacteria, UA: NONE SEEN
Casts: NONE SEEN /lpf
RBC, Urine: NONE SEEN /hpf (ref 0–2)

## 2019-03-04 LAB — UA/M W/RFLX CULTURE, ROUTINE
Bilirubin, UA: NEGATIVE
Glucose, UA: NEGATIVE
Ketones, UA: NEGATIVE
Leukocytes,UA: NEGATIVE
Nitrite, UA: NEGATIVE
Protein,UA: NEGATIVE
RBC, UA: NEGATIVE
Specific Gravity, UA: 1.021 (ref 1.005–1.030)
Urobilinogen, Ur: 0.2 mg/dL (ref 0.2–1.0)
pH, UA: 6.5 (ref 5.0–7.5)

## 2019-03-09 ENCOUNTER — Telehealth: Payer: Self-pay

## 2019-03-10 NOTE — Telephone Encounter (Signed)
Send message to beth 

## 2019-03-21 ENCOUNTER — Other Ambulatory Visit: Payer: Self-pay | Admitting: Adult Health

## 2019-03-21 DIAGNOSIS — G43909 Migraine, unspecified, not intractable, without status migrainosus: Secondary | ICD-10-CM

## 2019-04-15 ENCOUNTER — Telehealth: Payer: Self-pay | Admitting: Adult Health

## 2019-04-15 NOTE — Telephone Encounter (Signed)
Pt called asking if you would go ahead and do a referral to chiropractor, he states that you had discussed with him about going to one previously , he says for his back and neck, he does have a chiropractor and would like to continue with him , but medicaid will not pay for it without referral, he uses [facility] , [phone]

## 2019-06-23 ENCOUNTER — Other Ambulatory Visit: Payer: Self-pay | Admitting: Internal Medicine

## 2019-06-23 DIAGNOSIS — G43909 Migraine, unspecified, not intractable, without status migrainosus: Secondary | ICD-10-CM

## 2019-09-18 ENCOUNTER — Telehealth: Payer: Self-pay

## 2019-09-18 NOTE — Telephone Encounter (Signed)
Called lmom informing patient of appointment on 09/22/2019. klh 

## 2019-09-22 ENCOUNTER — Encounter: Payer: Self-pay | Admitting: Adult Health

## 2019-09-22 ENCOUNTER — Ambulatory Visit: Payer: Medicaid Other | Admitting: Adult Health

## 2019-09-22 ENCOUNTER — Other Ambulatory Visit: Payer: Self-pay

## 2019-09-22 ENCOUNTER — Encounter (INDEPENDENT_AMBULATORY_CARE_PROVIDER_SITE_OTHER): Payer: Self-pay

## 2019-09-22 VITALS — BP 135/81 | HR 62 | Temp 97.4°F | Resp 16 | Ht 78.0 in | Wt 359.0 lb

## 2019-09-22 DIAGNOSIS — M5441 Lumbago with sciatica, right side: Secondary | ICD-10-CM

## 2019-09-22 DIAGNOSIS — Z3009 Encounter for other general counseling and advice on contraception: Secondary | ICD-10-CM

## 2019-09-22 DIAGNOSIS — G43909 Migraine, unspecified, not intractable, without status migrainosus: Secondary | ICD-10-CM | POA: Diagnosis not present

## 2019-09-22 DIAGNOSIS — G8929 Other chronic pain: Secondary | ICD-10-CM

## 2019-09-22 MED ORDER — PREDNISONE 10 MG PO TABS: ORAL_TABLET | ORAL | 0 refills | Status: DC

## 2019-09-22 MED ORDER — METHOCARBAMOL 750 MG PO TABS: 750.0000 mg | ORAL_TABLET | Freq: Every day | ORAL | 0 refills | Status: DC | PRN

## 2019-09-22 NOTE — Progress Notes (Signed)
[facility] [address] [place], [place] [address]  Internal MEDICINE  Office Visit Note  Patient Name: Ethan Gomez  [id number]  [id number]  Date of Service: 09/22/2019  Chief Complaint  Patient presents with  . Acute Visit    back issues,  . Gastroesophageal Reflux     HPI Pt is here for a sick visit. He reports he is having ongoing back issues.  He has been seeing a chiropractor over the last 4 years.  He has been told he had DDD in the past.  He feels like his back pain is getting worse and has radiating pain down his leg from the hip. He has multiple x-rays from chiropractor that show DDD. He called an orthopedist he has been seen by in the past, and they told him they would need a referral AND an MRI to see him.  He is hoping to have that ordered today.  Given that this pain has been ongoing for some time, I think its reasonable to have an MRI.  He has a history of migraines, and is requesting a refill on his abortive therapy meds at this time.  HE would also like to discuss having a vasectomy.     Current Medication:  Outpatient Encounter Medications as of 09/22/2019  Medication Sig  . Galcanezumab-gnlm (EMGALITY) 120 MG/ML SOAJ Inject 120 mg into the skin every 30 (thirty) days.  . methocarbamol (ROBAXIN) 750 MG tablet Take 1 tablet (750 mg total) by mouth daily as needed for muscle spasms.  . traMADol (ULTRAM) 50 MG tablet Take 1-2 tablets (50-100 mg total) by mouth every 6 (six) hours as needed.  . [DISCONTINUED] methocarbamol (ROBAXIN) 750 MG tablet TAKE 1 TABLET (750 MG TOTAL) BY MOUTH DAILY AS NEEDED FOR MUSCLE SPASMS.  Marland Kitchen predniSONE (DELTASONE) 10 MG tablet Use per dose pack   No facility-administered encounter medications on file as of 09/22/2019.      Medical History: Past Medical History:  Diagnosis Date  . Acid reflux    occasional - Zantac as needed; none in 6 mos.  . Calcaneal spur of left foot 12/2014  . Family history of adverse reaction to  anesthesia    pt's mother has hx. of post-op N/V  . Migraines   . Rupture of tendon 12/2014   left ankle and foot     Vital Signs: BP 135/81   Pulse 62   Temp (!) 97.4 F (36.3 C)   Resp 16   Ht 6\' 6"  (1.981 m)   Wt (!) 359 lb (162.8 kg)   SpO2 98%   BMI 41.49 kg/m    Review of Systems  Constitutional: Negative.  Negative for chills, fatigue and unexpected weight change.  HENT: Negative.  Negative for congestion, rhinorrhea, sneezing and sore throat.   Eyes: Negative for redness.  Respiratory: Negative.  Negative for cough, chest tightness and shortness of breath.   Cardiovascular: Negative.  Negative for chest pain and palpitations.  Gastrointestinal: Negative.  Negative for abdominal pain, constipation, diarrhea, nausea and vomiting.  Endocrine: Negative.   Genitourinary: Negative.  Negative for dysuria and frequency.  Musculoskeletal: Negative.  Negative for arthralgias, back pain, joint swelling and neck pain.  Skin: Negative.  Negative for rash.  Allergic/Immunologic: Negative.   Neurological: Negative.  Negative for tremors and numbness.  Hematological: Negative for adenopathy. Does not bruise/bleed easily.  Psychiatric/Behavioral: Negative.  Negative for behavioral problems, sleep disturbance and suicidal ideas. The patient is not nervous/anxious.     Physical  Exam Vitals and nursing note reviewed.  Constitutional:      General: He is not in acute distress.    Appearance: He is well-developed. He is not diaphoretic.  HENT:     Head: Normocephalic and atraumatic.     Mouth/Throat:     Pharynx: No oropharyngeal exudate.  Eyes:     Pupils: Pupils are equal, round, and reactive to light.  Neck:     Thyroid: No thyromegaly.     Vascular: No JVD.     Trachea: No tracheal deviation.  Cardiovascular:     Rate and Rhythm: Normal rate and regular rhythm.     Heart sounds: Normal heart sounds. No murmur. No friction rub. No gallop.   Pulmonary:     Effort:  Pulmonary effort is normal. No respiratory distress.     Breath sounds: Normal breath sounds. No wheezing or rales.  Chest:     Chest wall: No tenderness.  Abdominal:     Palpations: Abdomen is soft.     Tenderness: There is no abdominal tenderness. There is no guarding.  Musculoskeletal:        General: Normal range of motion.     Cervical back: Normal range of motion and neck supple.  Lymphadenopathy:     Cervical: No cervical adenopathy.  Skin:    General: Skin is warm and dry.  Neurological:     Mental Status: He is alert and oriented to person, place, and time.     Cranial Nerves: No cranial nerve deficit.  Psychiatric:        Behavior: Behavior normal.        Thought Content: Thought content normal.        Judgment: Judgment normal.    Assessment/Plan: 1. Chronic midline low back pain with right-sided sciatica Pt should have MRI to evaluate DDD.   - Ambulatory referral to Chiropractic - MR Lumbar Spine Wo Contrast; Future  2. Morbid obesity (HCC) Obesity Counseling: Risk Assessment: An assessment of behavioral risk factors was made today and includes lack of exercise sedentary lifestyle, lack of portion control and poor dietary habits.  Risk Modification Advice: She was counseled on portion control guidelines. Restricting daily caloric intake to 1800. The detrimental long term effects of obesity on her health and ongoing poor compliance was also discussed with the patient.  3. Migraine without status migrainosus, not intractable, unspecified migraine type Refilled robaxin.  - methocarbamol (ROBAXIN) 750 MG tablet; Take 1 tablet (750 mg total) by mouth daily as needed for muscle spasms.  Dispense: 20 tablet; Refill: 0  4. Vasectomy evaluation Referral to urology for vasectomy eval.  - Ambulatory referral to Urology  General Counseling: faris coolman understanding of the findings of todays visit and agrees with plan of treatment. I have discussed any further  diagnostic evaluation that may be needed or ordered today. We also reviewed his medications today. he has been encouraged to call the office with any questions or concerns that should arise related to todays visit.   Orders Placed This Encounter  Procedures  . MR Lumbar Spine Wo Contrast  . Ambulatory referral to Chiropractic  . Ambulatory referral to Urology    Meds ordered this encounter  Medications  . predniSONE (DELTASONE) 10 MG tablet    Sig: Use per dose pack    Dispense:  21 tablet    Refill:  0  . methocarbamol (ROBAXIN) 750 MG tablet    Sig: Take 1 tablet (750 mg total) by mouth daily as  needed for muscle spasms.    Dispense:  20 tablet    Refill:  0    Time spent: 30 Minutes  This patient was seen by Blima Ledger AGNP-C in Collaboration with Dr Lyndon Code as a part of collaborative care agreement.  Johnna Acosta AGNP-C Internal Medicine

## 2019-10-05 ENCOUNTER — Other Ambulatory Visit: Payer: Self-pay | Admitting: Adult Health

## 2019-10-05 ENCOUNTER — Other Ambulatory Visit: Payer: Self-pay

## 2019-10-05 DIAGNOSIS — M79602 Pain in left arm: Secondary | ICD-10-CM

## 2019-10-05 DIAGNOSIS — G43909 Migraine, unspecified, not intractable, without status migrainosus: Secondary | ICD-10-CM

## 2019-10-06 ENCOUNTER — Ambulatory Visit: Payer: Medicaid Other | Admitting: Urology

## 2019-10-06 MED ORDER — TRAMADOL HCL 50 MG PO TABS: 50.0000 mg | ORAL_TABLET | Freq: Four times a day (QID) | ORAL | 0 refills | Status: DC | PRN

## 2019-10-11 ENCOUNTER — Other Ambulatory Visit: Payer: Self-pay | Admitting: Adult Health

## 2019-10-11 DIAGNOSIS — M79602 Pain in left arm: Secondary | ICD-10-CM

## 2019-10-12 NOTE — Telephone Encounter (Signed)
Advised we send med until he see orthopedic

## 2019-10-12 NOTE — Telephone Encounter (Signed)
Pt has an app on 4/27. Please change directions for future rx

## 2019-10-12 NOTE — Telephone Encounter (Signed)
Pt has an appt with [facility] on 10/16/19 and has one more dose left.

## 2019-10-16 ENCOUNTER — Other Ambulatory Visit: Payer: Self-pay | Admitting: Student

## 2019-10-16 ENCOUNTER — Telehealth: Payer: Self-pay

## 2019-10-16 DIAGNOSIS — M5441 Lumbago with sciatica, right side: Secondary | ICD-10-CM

## 2019-10-16 DIAGNOSIS — M5416 Radiculopathy, lumbar region: Secondary | ICD-10-CM

## 2019-10-16 NOTE — Telephone Encounter (Signed)
Confirmed appointment on 10/20/2019 and screened for covid. klh °

## 2019-10-20 ENCOUNTER — Ambulatory Visit (INDEPENDENT_AMBULATORY_CARE_PROVIDER_SITE_OTHER): Payer: Medicaid Other | Admitting: Urology

## 2019-10-20 ENCOUNTER — Encounter: Payer: Self-pay | Admitting: Urology

## 2019-10-20 ENCOUNTER — Ambulatory Visit: Payer: Medicaid Other | Admitting: Adult Health

## 2019-10-20 ENCOUNTER — Encounter: Payer: Self-pay | Admitting: Adult Health

## 2019-10-20 ENCOUNTER — Other Ambulatory Visit: Payer: Self-pay

## 2019-10-20 VITALS — BP 134/77 | HR 94 | Ht 78.0 in | Wt 345.0 lb

## 2019-10-20 VITALS — BP 132/80 | HR 79 | Temp 97.5°F | Resp 16 | Ht 78.0 in | Wt 353.2 lb

## 2019-10-20 DIAGNOSIS — M5441 Lumbago with sciatica, right side: Secondary | ICD-10-CM | POA: Diagnosis not present

## 2019-10-20 DIAGNOSIS — G8929 Other chronic pain: Secondary | ICD-10-CM

## 2019-10-20 DIAGNOSIS — G43909 Migraine, unspecified, not intractable, without status migrainosus: Secondary | ICD-10-CM | POA: Diagnosis not present

## 2019-10-20 DIAGNOSIS — Z3009 Encounter for other general counseling and advice on contraception: Secondary | ICD-10-CM | POA: Diagnosis not present

## 2019-10-20 MED ORDER — TRAMADOL HCL 50 MG PO TABS: 100.0000 mg | ORAL_TABLET | Freq: Two times a day (BID) | ORAL | 0 refills | Status: AC

## 2019-10-20 NOTE — Progress Notes (Signed)
[facility] [place], [place] [address]  Internal MEDICINE  Office Visit Note  Patient Name: Ethan Gomez  [id number]  [id number]  Date of Service: 10/20/2019  Chief Complaint  Patient presents with  . Follow-up  . Gastroesophageal Reflux    HPI  Pt is here for follow up on GERD.  He continues to report low back pain that is now constant.  He has seen neuro surgery.  They have ordered an MRI which he is scheduled to have.  He did have xrays that shows disc compression. He will likely need steroid injections, and then decompression surgery per Neuro surgery.   His blood pressure is elevated slightly today initially.  On recheck 132/80.  Denies Chest pain, Shortness of breath, palpitations, headache, or blurred vision.        Current Medication: Outpatient Encounter Medications as of 10/20/2019  Medication Sig  . Galcanezumab-gnlm (EMGALITY) 120 MG/ML SOAJ Inject 120 mg into the skin every 30 (thirty) days.  . methocarbamol (ROBAXIN) 500 MG tablet Take by mouth.  . Multiple Vitamin (MULTI-VITAMIN) tablet Take by mouth.  . traMADol (ULTRAM) 50 MG tablet Take 2 tablets (100 mg total) by mouth 2 (two) times daily for 7 days.  . [DISCONTINUED] traMADol (ULTRAM) 50 MG tablet Take by mouth.  . [DISCONTINUED] methocarbamol (ROBAXIN) 750 MG tablet TAKE 1 TABLET (750 MG TOTAL) BY MOUTH DAILY AS NEEDED FOR MUSCLE SPASMS.  . [DISCONTINUED] predniSONE (DELTASONE) 10 MG tablet Use per dose pack   No facility-administered encounter medications on file as of 10/20/2019.    Surgical History: Past Surgical History:  Procedure Laterality Date  . BONE EXOSTOSIS EXCISION Left 01/27/2015   Procedure: EXCISION LEFT HEEL OS CALCANEAL SPUR ;  Surgeon: Ninetta Lights, MD;  Location: [place];  Service: Orthopedics;  Laterality: Left;  ANESTHESIA: GENERAL, BLOCK  . NO PAST SURGERIES    . TENDON REPAIR Left 01/27/2015   Procedure: REPAIR LEFT PERONEUS LONGUS TENDON;   Surgeon: Ninetta Lights, MD;  Location: [place];  Service: Orthopedics;  Laterality: Left;    Medical History: Past Medical History:  Diagnosis Date  . Acid reflux    occasional - Zantac as needed; none in 6 mos.  . Calcaneal spur of left foot 12/2014  . Family history of adverse reaction to anesthesia    pt's mother has hx. of post-op N/V  . Migraines   . Rupture of tendon 12/2014   left ankle and foot    Family History: Family History  Problem Relation Age of Onset  . Anesthesia problems Mother        post-op N/V  . Arthritis Mother   . Hyperlipidemia Mother   . Hypertension Mother   . Arthritis Other        all 4 of his grandparents  . Hyperlipidemia Other        all grandparents  . Hypertension Maternal Grandmother   . Hypertension Paternal Grandfather   . Diabetes Maternal Grandfather     Social History   Socioeconomic History  . Marital status: Married    Spouse name: Not on file  . Number of children: Not on file  . Years of education: Not on file  . Highest education level: Not on file  Occupational History  . Not on file  Tobacco Use  . Smoking status: Never Smoker  . Smokeless tobacco: Former Systems developer  . Tobacco comment: no smokeless tobacco since age 25  Substance and  Sexual Activity  . Alcohol use: Yes    Alcohol/week: 0.0 standard drinks    Comment: rare  . Drug use: No  . Sexual activity: Yes  Other Topics Concern  . Not on file  Social History Narrative  . Not on file   Social Determinants of Health   Financial Resource Strain:   . Difficulty of Paying Living Expenses:   Food Insecurity:   . Worried About Programme researcher, broadcasting/film/video in the Last Year:   . Barista in the Last Year:   Transportation Needs:   . Freight forwarder (Medical):   Marland Kitchen Lack of Transportation (Non-Medical):   Physical Activity:   . Days of Exercise per Week:   . Minutes of Exercise per Session:   Stress:   . Feeling of Stress :   Social  Connections:   . Frequency of Communication with Friends and Family:   . Frequency of Social Gatherings with Friends and Family:   . Attends Religious Services:   . Active Member of Clubs or Organizations:   . Attends Banker Meetings:   Marland Kitchen Marital Status:   Intimate Partner Violence:   . Fear of Current or Ex-Partner:   . Emotionally Abused:   Marland Kitchen Physically Abused:   . Sexually Abused:       Review of Systems  Constitutional: Negative.  Negative for chills, fatigue and unexpected weight change.  HENT: Negative.  Negative for congestion, rhinorrhea, sneezing and sore throat.   Eyes: Negative for redness.  Respiratory: Negative.  Negative for cough, chest tightness and shortness of breath.   Cardiovascular: Negative.  Negative for chest pain and palpitations.  Gastrointestinal: Negative.  Negative for abdominal pain, constipation, diarrhea, nausea and vomiting.  Endocrine: Negative.   Genitourinary: Negative.  Negative for dysuria and frequency.  Musculoskeletal: Negative.  Negative for arthralgias, back pain, joint swelling and neck pain.  Skin: Negative.  Negative for rash.  Allergic/Immunologic: Negative.   Neurological: Negative.  Negative for tremors and numbness.  Hematological: Negative for adenopathy. Does not bruise/bleed easily.  Psychiatric/Behavioral: Negative.  Negative for behavioral problems, sleep disturbance and suicidal ideas. The patient is not nervous/anxious.     Vital Signs: BP (!) 143/94   Pulse 79   Temp (!) 97.5 F (36.4 C)   Resp 16   Ht 6\' 6"  (1.981 m)   Wt (!) 353 lb 3.2 oz (160.2 kg)   SpO2 98%   BMI 40.82 kg/m    Physical Exam Vitals and nursing note reviewed.  Constitutional:      General: He is not in acute distress.    Appearance: He is well-developed. He is not diaphoretic.  HENT:     Head: Normocephalic and atraumatic.     Mouth/Throat:     Pharynx: No oropharyngeal exudate.  Eyes:     Pupils: Pupils are equal,  round, and reactive to light.  Neck:     Thyroid: No thyromegaly.     Vascular: No JVD.     Trachea: No tracheal deviation.  Cardiovascular:     Rate and Rhythm: Normal rate and regular rhythm.     Heart sounds: Normal heart sounds. No murmur. No friction rub. No gallop.   Pulmonary:     Effort: Pulmonary effort is normal. No respiratory distress.     Breath sounds: Normal breath sounds. No wheezing or rales.  Chest:     Chest wall: No tenderness.  Abdominal:     Palpations: Abdomen is  soft.     Tenderness: There is no abdominal tenderness. There is no guarding.  Musculoskeletal:        General: Normal range of motion.     Cervical back: Normal range of motion and neck supple.  Lymphadenopathy:     Cervical: No cervical adenopathy.  Skin:    General: Skin is warm and dry.  Neurological:     Mental Status: He is alert and oriented to person, place, and time.     Cranial Nerves: No cranial nerve deficit.  Psychiatric:        Behavior: Behavior normal.        Thought Content: Thought content normal.        Judgment: Judgment normal.    Assessment/Plan: 1. Chronic midline low back pain with right-sided sciatica Reviewed risks and possible side effects associated with taking opiates, benzodiazepines and other CNS depressants. Combination of these could cause dizziness and drowsiness. Advised patient not to drive or operate machinery when taking these medications, as patient's and other's life can be at risk and will have consequences. Patient verbalized understanding in this matter. Dependence and abuse for these drugs will be monitored closely. A Controlled substance policy and procedure is on file which allows [facility] to order a urine drug screen test at any visit. Patient understands and agrees with the plan - traMADol (ULTRAM) 50 MG tablet; Take 2 tablets (100 mg total) by mouth 2 (two) times daily for 7 days.  Dispense: 30 tablet; Refill: 0  2. Morbid obesity  (HCC) Obesity Counseling: Risk Assessment: An assessment of behavioral risk factors was made today and includes lack of exercise sedentary lifestyle, lack of portion control and poor dietary habits.  Risk Modification Advice: She was counseled on portion control guidelines. Restricting daily caloric intake to 1800. The detrimental long term effects of obesity on her health and ongoing poor compliance was also discussed with the patient.  3. Migraine without status migrainosus, not intractable, unspecified migraine type Has not had a Migraine in some time.    General Counseling: rayquon uselman understanding of the findings of todays visit and agrees with plan of treatment. I have discussed any further diagnostic evaluation that may be needed or ordered today. We also reviewed his medications today. he has been encouraged to call the office with any questions or concerns that should arise related to todays visit.    No orders of the defined types were placed in this encounter.   Meds ordered this encounter  Medications  . traMADol (ULTRAM) 50 MG tablet    Sig: Take 2 tablets (100 mg total) by mouth 2 (two) times daily for 7 days.    Dispense:  30 tablet    Refill:  0    Time spent: 30 Minutes   This patient was seen by Blima Ledger AGNP-C in Collaboration with Dr Lyndon Code as a part of collaborative care agreement     Johnna Acosta AGNP-C Internal medicine

## 2019-10-20 NOTE — Progress Notes (Signed)
10/20/19 11:08 AM   Ethan Gomez 03-29-1986 [id number]  CC: Discuss vasectomy  HPI: I saw Mr. Mcdaid in urology clinic today for evaluation of vasectomy.  He is a 34 year old male with obesity and a BMI of 40 who is interested in vasectomy for permanent sterilization.  He is married with 3 children ages 57, 66, and 2.  His wife is currently on oral birth control but has had some side effects and they are interested in alternative contraceptive options.  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 Acid reflux    occasional - Zantac as needed; none in 6 mos.  . Calcaneal spur of left foot 12/2014  . Family history of adverse reaction to anesthesia    pt's mother has hx. of post-op N/V  . Migraines   . Rupture of tendon 12/2014   left ankle and foot    Surgical History: Past Surgical History:  Procedure Laterality Date  . BONE EXOSTOSIS EXCISION Left 01/27/2015   Procedure: EXCISION LEFT HEEL OS CALCANEAL SPUR ;  Surgeon: Loreta Ave, MD;  Location: [facility];  Service: Orthopedics;  Laterality: Left;  ANESTHESIA: GENERAL, BLOCK  . NO PAST SURGERIES    . TENDON REPAIR Left 01/27/2015   Procedure: REPAIR LEFT PERONEUS LONGUS TENDON;  Surgeon: Loreta Ave, MD;  Location: [facility];  Service: Orthopedics;  Laterality: Left;    Family History: Family History  Problem Relation Age of Onset  . Anesthesia problems Mother        post-op N/V  . Arthritis Mother   . Hyperlipidemia Mother   . Hypertension Mother   . Arthritis Other        all 4 of his grandparents  . Hyperlipidemia Other        all grandparents  . Hypertension Maternal Grandmother   . Hypertension Paternal Grandfather   . Diabetes Maternal Grandfather     Social History:  reports that he has  never smoked. He has quit using smokeless tobacco. He reports current alcohol use. He reports that he does not use drugs.  Physical Exam: BP 134/77 (BP Location: Left Arm, Patient Position: Sitting, Cuff Size: Large)   Pulse 94   Ht 6\' 6"  (1.981 m)   Wt (!) 345 lb (156.5 kg)   BMI 39.87 kg/m    Constitutional: Obese, alert and oriented Cardiovascular: No clubbing, cyanosis, or edema. Respiratory: Normal respiratory effort, no increased work of breathing. GI: Abdomen is soft, nontender, nondistended, no abdominal masses GU: Thickened scrotum bilaterally, vas deferens very challenging to palpate, but palpable deep, no testicular masses.  Meatus widely patent  Assessment & Plan:   In summary, he is a 34 year old male with obesity who is interested in vasectomy for permanent sterilization.  We discussed the risks and benefits of vasectomy at length.  Vasectomy is intended to be a permanent form of contraception, and does not produce immediate sterility.  Following vasectomy another form of contraception is required until vas occlusion is confirmed by a post-vasectomy semen analysis obtained 2-3 months after the procedure.  Even after vas occlusion is confirmed, vasectomy is not 100% reliable in preventing pregnancy, and the failure rate is approximately 06/1998.  Repeat vasectomy is required in less than 1% of patients.  He should refrain from ejaculation for  1 week after vasectomy.  Options for fertility after vasectomy include vasectomy reversal, and sperm retrieval with in vitro fertilization or ICSI.  These options are not always successful and may be expensive.  Finally, there are other permanent and non-permanent alternatives to vasectomy available. There is no risk of erectile dysfunction, and the volume of semen will be similar to prior, as the majority of the ejaculate is from the prostate and seminal vesicles.   The procedure takes ~20 minutes.  We recommend patients take 5-10 mg of Valium  30 minutes prior, and he will need a driver post-procedure.  Local anesthetic is injected into the scrotal skin and a small segment of the vas deferens is removed, and the ends occluded. The complication rate is approximately 1-2%, and includes bleeding, infection, and development of chronic scrotal pain.  PLAN: With his difficult exam secondary to obesity, needle phobia, and difficulty with procedures under local anesthetic in the past, I strongly recommended performing vasectomy in the operating room with either sedation or anesthesia.  He is in agreement with this plan.  I recommended discussing with his insurance company to confirm this would be covered prior to scheduling the procedure.    I spent 45 total minutes on the day of the encounter including pre-visit review of the medical record, face-to-face time with the patient, and post visit ordering of labs/imaging/tests.  Nickolas Madrid, MD 10/20/2019  [facility] [address], [place], [place] [address] [phone]

## 2019-10-20 NOTE — Patient Instructions (Signed)
Vasectomy, Care After °This sheet gives you information about how to care for yourself after your procedure. Your health care provider may also give you more specific instructions. If you have problems or questions, contact your health care provider. °What can I expect after the procedure? °After your procedure, it is common to have: °· Mild pain, swelling, redness, or discomfort in your scrotum. °· Some blood coming from your incisions or puncture sites for one or two days. °· Blood in your semen. °Follow these instructions at home: °Medicines ° °· Take over-the-counter and prescription medicines only as told by your health care provider. °· Avoid taking NSAIDs such as aspirin and ibuprofen, because these medicines can make bleeding worse. °Activity °· For the first 2 days after surgery, avoid physical activity and exercise that require a lot of energy. Ask your health care provider what activities are safe for you. °· Do not participate in sports or perform heavy physical labor until your pain has improved, or until your health care provider says it is okay. °· Do not ejaculate for at least 1 week after the procedure, or as long as directed. °· You may resume sexual activity 7-10 days after your procedure, or when your health care provider approves. Use a different method of birth control (contraception) until you have had test results that confirm that there is no sperm in your semen. °Scrotal support °· Use scrotal support, such as a jock strap or underwear with a supportive pouch, as needed for one week after your procedure. °· If you feel discomfort in your scrotum, you may remove the scrotal support to see if the discomfort is relieved. Sometimes scrotal support can press on the scrotum and cause or worsen discomfort. °· If your skin gets irritated, you may add some germ-free (sterile), fluffed bandages or a clean washcloth to the scrotal support. °General instructions °· Put ice on the injured area: °? Put  ice in a plastic bag. °? Place a towel between your skin and the bag. °? Leave the ice on for 20 minutes, 2-3 times a day. °· Check your incisions or puncture sites every day for signs of infection. Check for: °? Redness, swelling, or pain. °? Fluid or blood. °? Warmth. °? Pus or a bad smell. °· Leave stitches (sutures) in place. The sutures will dissolve on their own and do not need to be removed. °· Keep all follow-up visits as told by your health care provider. This is important because you will need a test to confirm that there is no sperm in your semen. Multiple ejaculations are needed to clear out sperm that were beyond the vasectomy site. You will need one test result showing that there is no sperm in your semen before you can resume unprotected sex. This may take 2-4 months after your procedure. °· Do not drive for 24 hours if you were given a sedative to help you relax. °Contact a health care provider if: °· You have redness, swelling, or more pain around your incision or puncture site, or in your scrotum area in general. °· You have bleeding from your incision or puncture site. °· You have pus or a bad smell coming from your incision or puncture site. °· You have a fever. °· Your incision or puncture site opens up. °Get help right away if: °· You develop a rash. °· You have difficulty breathing. °Summary °· After your procedure it is common to have mild pain, swelling, redness, or discomfort in your scrotum. °·   Avoid physical activity and exercise that requires a lot of energy for the first 2 days after surgery. °· Put ice on the injured area. Leave the ice on for 20 minutes, 2-3 times a day. °· Do not drive for 24 hours if you were given a sedative to help you relax. °This information is not intended to replace advice given to you by your health care provider. Make sure you discuss any questions you have with your health care provider. °Document Revised: 05/24/2017 Document Reviewed: 09/07/2016 °Elsevier  Patient Education © 2020 [employer]. ° °

## 2019-10-22 ENCOUNTER — Other Ambulatory Visit: Payer: Self-pay | Admitting: Radiology

## 2019-10-30 ENCOUNTER — Ambulatory Visit
Admission: RE | Admit: 2019-10-30 | Discharge: 2019-10-30 | Disposition: A | Discharge status: Home / Self Care | Payer: Medicaid Other | Source: Ambulatory Visit | Attending: Student | Admitting: Student

## 2019-10-30 ENCOUNTER — Other Ambulatory Visit: Payer: Self-pay

## 2019-10-30 DIAGNOSIS — M5441 Lumbago with sciatica, right side: Secondary | ICD-10-CM | POA: Diagnosis present

## 2019-10-30 DIAGNOSIS — M5416 Radiculopathy, lumbar region: Secondary | ICD-10-CM | POA: Diagnosis present

## 2019-11-06 ENCOUNTER — Other Ambulatory Visit: Payer: Self-pay

## 2019-11-06 ENCOUNTER — Encounter: Payer: Self-pay | Admitting: Emergency Medicine

## 2019-11-06 DIAGNOSIS — M79621 Pain in right upper arm: Secondary | ICD-10-CM | POA: Diagnosis present

## 2019-11-06 DIAGNOSIS — Z5321 Procedure and treatment not carried out due to patient leaving prior to being seen by health care provider: Secondary | ICD-10-CM | POA: Insufficient documentation

## 2019-11-06 LAB — BASIC METABOLIC PANEL
Anion gap: 8 (ref 5–15)
BUN: 15 mg/dL (ref 6–20)
CO2: 26 mmol/L (ref 22–32)
Calcium: 9.4 mg/dL (ref 8.9–10.3)
Chloride: 103 mmol/L (ref 98–111)
Creatinine, Ser: 0.91 mg/dL (ref 0.61–1.24)
GFR calc Af Amer: >60 mL/min (ref >60)
GFR calc non Af Amer: >60 mL/min (ref >60)
Glucose, Bld: 89 mg/dL (ref 70–99)
Potassium: 3.8 mmol/L (ref 3.5–5.1)
Sodium: 137 mmol/L (ref 135–145)

## 2019-11-06 LAB — CBC WITH DIFFERENTIAL/PLATELET
Abs Immature Granulocytes: 0.07 10*3/uL (ref 0.00–0.07)
Basophils Absolute: 0.1 10*3/uL (ref 0.0–0.1)
Basophils Relative: 0 %
Eosinophils Absolute: 0.1 10*3/uL (ref 0.0–0.5)
Eosinophils Relative: 0 %
HCT: 43.5 % (ref 39.0–52.0)
Hemoglobin: 14.6 g/dL (ref 13.0–17.0)
Immature Granulocytes: 1 %
Lymphocytes Relative: 33 %
Lymphs Abs: 4.4 10*3/uL — ABNORMAL HIGH (ref 0.7–4.0)
MCH: 29.6 pg (ref 26.0–34.0)
MCHC: 33.6 g/dL (ref 30.0–36.0)
MCV: 88.1 fL (ref 80.0–100.0)
Monocytes Absolute: 1.1 10*3/uL — ABNORMAL HIGH (ref 0.1–1.0)
Monocytes Relative: 8 %
Neutro Abs: 7.6 10*3/uL (ref 1.7–7.7)
Neutrophils Relative %: 58 %
Platelets: 368 10*3/uL (ref 150–400)
RBC: 4.94 MIL/uL (ref 4.22–5.81)
RDW: 13.1 % (ref 11.5–15.5)
WBC: 13.3 10*3/uL — ABNORMAL HIGH (ref 4.0–10.5)
nRBC: 0 % (ref 0.0–0.2)

## 2019-11-06 NOTE — ED Triage Notes (Signed)
Pt reports right arm redness, swelling, and pain started about 4 hrs ago denies any injury to area. Pt talks in complete sentences no respiratory distress noted

## 2019-11-07 ENCOUNTER — Emergency Department
Admission: EM | Admit: 2019-11-07 | Discharge: 2019-11-07 | Disposition: A | Discharge status: Home / Self Care | Payer: Medicaid Other | Attending: Emergency Medicine | Admitting: Emergency Medicine

## 2019-11-07 NOTE — ED Notes (Signed)
Pt to STAT desk stating that his arm pain was correcting and that he was going to leave.

## 2019-11-12 ENCOUNTER — Other Ambulatory Visit: Payer: Self-pay | Admitting: Urology

## 2019-11-12 DIAGNOSIS — Z302 Encounter for sterilization: Secondary | ICD-10-CM

## 2019-11-13 ENCOUNTER — Telehealth: Payer: Self-pay

## 2019-11-13 NOTE — Telephone Encounter (Signed)
Confirmed appointment on 11/17/2019 and screened for covid. [facility] 

## 2019-11-16 ENCOUNTER — Other Ambulatory Visit: Payer: Self-pay

## 2019-11-16 ENCOUNTER — Encounter
Admission: RE | Admit: 2019-11-16 | Discharge: 2019-11-16 | Disposition: A | Discharge status: Home / Self Care | Payer: Medicaid Other | Source: Ambulatory Visit | Attending: Urology | Admitting: Urology

## 2019-11-16 NOTE — Patient Instructions (Signed)
Your procedure is scheduled on: 11-20-19 FRIDAY Report to Same Day Surgery 2nd floor medical mall [facility] Entrance-take elevator on left to 2nd floor.  Check in with surgery information desk.) To find out your arrival time please call [phone] between 1PM - 3PM on 11-19-19 THURSDAY  Remember: Instructions that are not followed completely may result in serious medical risk, up to and including death, or upon the discretion of your surgeon and anesthesiologist your surgery may need to be rescheduled.    _x___ 1. Do not eat food after midnight the night before your procedure. NO GUM OR CANDY AFTER MIDNIGHT. You may drink clear liquids up to 2 hours before you are scheduled to arrive at the hospital for your procedure.  Do not drink clear liquids within 2 hours of your scheduled arrival to the hospital.  Clear liquids include  --Water or Apple juice without pulp  --Gatorade  --Black Coffee or Clear Tea (No milk, no creamers, do not add anything to the coffee or Tea-ok to add sugar )   ____Ensure clear carbohydrate drink on the way to the hospital for bariatric patients  ____Ensure clear carbohydrate drink 3 hours before surgery.    __x__ 2. No Alcohol for 24 hours before or after surgery.   __x__3. No Smoking or e-cigarettes for 24 prior to surgery.  Do not use any chewable tobacco products for at least 6 hour prior to surgery   ____  4. Bring all medications with you on the day of surgery if instructed.    __x__ 5. Notify your doctor if there is any change in your medical condition     (cold, fever, infections).    x___6. On the morning of surgery brush your teeth with toothpaste and water.  You may rinse your mouth with mouth wash if you wish.  Do not swallow any toothpaste or mouthwash.   Do not wear jewelry, make-up, hairpins, clips or nail polish.  Do not wear lotions, powders, or perfumes. You may wear deodorant.  Do not shave 48 hours prior to surgery. Men may shave face  and neck.  Do not bring valuables to the hospital.    [facility] is not responsible for any belongings or valuables.               Contacts, dentures or bridgework may not be worn into surgery.  Leave your suitcase in the car. After surgery it may be brought to your room.  For patients admitted to the hospital, discharge time is determined by your  treatment team.  _  Patients discharged the day of surgery will not be allowed to drive home.  You will need someone to drive you home and stay with you the night of your procedure.    Please read over the following fact sheets that you were given:   [facility] Preparing for Surgery   ____ TAKE THE FOLLOWING MEDICATION THE MORNING OF SURGERY WITH A SMALL SIP OF WATER. These include:  1. NONE  2.  3.  4.  5.  6.  ____Fleets enema or Magnesium Citrate as directed.   ____ Use CHG Soap or sage wipes as directed on instruction sheet   ____ Use inhalers on the day of surgery and bring to hospital day of surgery  ____ Stop Metformin and Janumet 2 days prior to surgery.    ____ Take 1/2 of usual insulin dose the night before surgery and none on the morning surgery.   ____ Follow  recommendations from Cardiologist, Pulmonologist or PCP regarding stopping Aspirin, Coumadin, Plavix ,Eliquis, Effient, or Pradaxa, and Pletal.  X____Stop Anti-inflammatories such as Advil, Aleve, Ibuprofen, Motrin, Naproxen, Naprosyn, Goodies powders or aspirin products NOW-OK to take Tylenol OR TRAMADOL IF NEEDED   ____ Stop supplements until after surgery   ____ Bring C-Pap to the hospital.

## 2019-11-17 ENCOUNTER — Ambulatory Visit: Payer: Medicaid Other | Admitting: Adult Health

## 2019-11-18 ENCOUNTER — Other Ambulatory Visit: Payer: Self-pay

## 2019-11-18 ENCOUNTER — Other Ambulatory Visit
Admission: RE | Admit: 2019-11-18 | Discharge: 2019-11-18 | Disposition: A | Discharge status: Home / Self Care | Payer: Medicaid Other | Source: Ambulatory Visit | Attending: Urology | Admitting: Urology

## 2019-11-18 DIAGNOSIS — Z20822 Contact with and (suspected) exposure to covid-19: Secondary | ICD-10-CM | POA: Insufficient documentation

## 2019-11-18 DIAGNOSIS — Z01812 Encounter for preprocedural laboratory examination: Secondary | ICD-10-CM | POA: Diagnosis not present

## 2019-11-18 LAB — SARS CORONAVIRUS 2 (TAT 6-24 HRS): SARS Coronavirus 2: NEGATIVE

## 2019-11-20 ENCOUNTER — Ambulatory Visit
Admission: RE | Admit: 2019-11-20 | Discharge: 2019-11-20 | Disposition: A | Discharge status: Home / Self Care | Payer: Medicaid Other | Attending: Urology | Admitting: Urology

## 2019-11-20 ENCOUNTER — Ambulatory Visit: Payer: Medicaid Other | Admitting: Anesthesiology

## 2019-11-20 ENCOUNTER — Encounter
Admission: RE | Disposition: A | Discharge status: Home / Self Care | Payer: Self-pay | Source: Home / Self Care | Attending: Urology

## 2019-11-20 ENCOUNTER — Other Ambulatory Visit: Payer: Self-pay

## 2019-11-20 ENCOUNTER — Encounter: Payer: Self-pay | Admitting: Urology

## 2019-11-20 DIAGNOSIS — Z302 Encounter for sterilization: Secondary | ICD-10-CM

## 2019-11-20 DIAGNOSIS — Z6839 Body mass index (BMI) 39.0-39.9, adult: Secondary | ICD-10-CM | POA: Insufficient documentation

## 2019-11-20 HISTORY — PX: VASECTOMY: SHX75

## 2019-11-20 SURGERY — VASECTOMY
Anesthesia: General | Site: Scrotum

## 2019-11-20 MED ORDER — FAMOTIDINE 20 MG PO TABS: 20.0000 mg | ORAL_TABLET | Freq: Once | ORAL | Status: AC

## 2019-11-20 MED ORDER — MIDAZOLAM HCL 2 MG/2ML IJ SOLN
INTRAMUSCULAR | Status: AC
  Filled 2019-11-20: qty 2

## 2019-11-20 MED ORDER — PROPOFOL 10 MG/ML IV BOLUS
INTRAVENOUS | Status: DC | PRN
  Administered 2019-11-20: 200 mg via INTRAVENOUS

## 2019-11-20 MED ORDER — FENTANYL CITRATE (PF) 100 MCG/2ML IJ SOLN
25.0000 ug | INTRAMUSCULAR | Status: DC | PRN
  Administered 2019-11-20 (×3): 25 ug via INTRAVENOUS

## 2019-11-20 MED ORDER — ORAL CARE MOUTH RINSE: 15.0000 mL | Freq: Once | OROMUCOSAL | Status: AC

## 2019-11-20 MED ORDER — CHLORHEXIDINE GLUCONATE 0.12 % MT SOLN
OROMUCOSAL | Status: AC
  Administered 2019-11-20: 15 mL via OROMUCOSAL
  Filled 2019-11-20: qty 15

## 2019-11-20 MED ORDER — CEFAZOLIN SODIUM-DEXTROSE 2-4 GM/100ML-% IV SOLN
2.0000 g | INTRAVENOUS | Status: AC
  Administered 2019-11-20: 3 g via INTRAVENOUS

## 2019-11-20 MED ORDER — FENTANYL CITRATE (PF) 100 MCG/2ML IJ SOLN
INTRAMUSCULAR | Status: DC | PRN
  Administered 2019-11-20 (×2): 50 ug via INTRAVENOUS

## 2019-11-20 MED ORDER — PROMETHAZINE HCL 25 MG/ML IJ SOLN: 6.2500 mg | INTRAMUSCULAR | Status: DC | PRN

## 2019-11-20 MED ORDER — FENTANYL CITRATE (PF) 100 MCG/2ML IJ SOLN
INTRAMUSCULAR | Status: AC
  Administered 2019-11-20: 25 ug via INTRAVENOUS
  Filled 2019-11-20: qty 2

## 2019-11-20 MED ORDER — FENTANYL CITRATE (PF) 100 MCG/2ML IJ SOLN
INTRAMUSCULAR | Status: AC
  Filled 2019-11-20: qty 2

## 2019-11-20 MED ORDER — LIDOCAINE HCL (PF) 1 % IJ SOLN
INTRAMUSCULAR | Status: DC | PRN
  Administered 2019-11-20: 3 mL

## 2019-11-20 MED ORDER — DEXAMETHASONE SODIUM PHOSPHATE 10 MG/ML IJ SOLN
INTRAMUSCULAR | Status: AC
  Filled 2019-11-20: qty 1

## 2019-11-20 MED ORDER — PROPOFOL 10 MG/ML IV BOLUS
INTRAVENOUS | Status: AC
  Filled 2019-11-20: qty 40

## 2019-11-20 MED ORDER — SUCCINYLCHOLINE CHLORIDE 20 MG/ML IJ SOLN
INTRAMUSCULAR | Status: DC | PRN
  Administered 2019-11-20: 160 mg via INTRAVENOUS

## 2019-11-20 MED ORDER — DEXAMETHASONE SODIUM PHOSPHATE 10 MG/ML IJ SOLN
INTRAMUSCULAR | Status: DC | PRN
  Administered 2019-11-20: 10 mg via INTRAVENOUS

## 2019-11-20 MED ORDER — FAMOTIDINE 20 MG PO TABS
ORAL_TABLET | ORAL | Status: AC
  Administered 2019-11-20: 20 mg via ORAL
  Filled 2019-11-20: qty 1

## 2019-11-20 MED ORDER — CHLORHEXIDINE GLUCONATE 0.12 % MT SOLN: 15.0000 mL | Freq: Once | OROMUCOSAL | Status: AC

## 2019-11-20 MED ORDER — MIDAZOLAM HCL 2 MG/2ML IJ SOLN
INTRAMUSCULAR | Status: DC | PRN
  Administered 2019-11-20: 2 mg via INTRAVENOUS

## 2019-11-20 MED ORDER — CEFAZOLIN SODIUM-DEXTROSE 2-4 GM/100ML-% IV SOLN
INTRAVENOUS | Status: AC
  Filled 2019-11-20: qty 100

## 2019-11-20 MED ORDER — LACTATED RINGERS IV SOLN: INTRAVENOUS | Status: DC

## 2019-11-20 SURGICAL SUPPLY — 37 items
BLADE CLIPPER SURG (BLADE) ×3 IMPLANT
BLADE SURG 15 STRL LF DISP TIS (BLADE) ×1 IMPLANT
BLADE SURG 15 STRL SS (BLADE) ×2
CANISTER SUCT 1200ML W/VALVE (MISCELLANEOUS) ×3 IMPLANT
CHLORAPREP W/TINT 26 (MISCELLANEOUS) ×3 IMPLANT
COVER WAND RF STERILE (DRAPES) IMPLANT
DERMABOND ADVANCED (GAUZE/BANDAGES/DRESSINGS) ×2
DERMABOND ADVANCED .7 DNX12 (GAUZE/BANDAGES/DRESSINGS) ×1 IMPLANT
DRAPE LAPAROTOMY 77X122 PED (DRAPES) ×3 IMPLANT
DRSG GAUZE FLUFF 36X18 (GAUZE/BANDAGES/DRESSINGS) ×3 IMPLANT
DRSG TELFA 4X3 1S NADH ST (GAUZE/BANDAGES/DRESSINGS) ×3 IMPLANT
ELECT CAUTERY NEEDLE TIP 1.0 (MISCELLANEOUS) ×3
ELECT REM PT RETURN 9FT ADLT (ELECTROSURGICAL) ×3
ELECTRODE CAUTERY NEDL TIP 1.0 (MISCELLANEOUS) ×1 IMPLANT
ELECTRODE REM PT RTRN 9FT ADLT (ELECTROSURGICAL) ×1 IMPLANT
GAUZE SPONGE 4X4 12PLY STRL (GAUZE/BANDAGES/DRESSINGS) IMPLANT
GLOVE BIOGEL PI IND STRL 7.5 (GLOVE) ×1 IMPLANT
GLOVE BIOGEL PI INDICATOR 7.5 (GLOVE) ×2
GOWN STRL REUS W/ TWL LRG LVL3 (GOWN DISPOSABLE) ×1 IMPLANT
GOWN STRL REUS W/ TWL XL LVL3 (GOWN DISPOSABLE) ×1 IMPLANT
GOWN STRL REUS W/TWL LRG LVL3 (GOWN DISPOSABLE) ×2
GOWN STRL REUS W/TWL XL LVL3 (GOWN DISPOSABLE) ×2
KIT TURNOVER KIT A (KITS) ×3 IMPLANT
LABEL OR SOLS (LABEL) IMPLANT
NEEDLE HYPO 25X1 1.5 SAFETY (NEEDLE) ×3 IMPLANT
NS IRRIG 500ML POUR BTL (IV SOLUTION) ×3 IMPLANT
PACK BASIN MINOR (MISCELLANEOUS) ×3 IMPLANT
SUCTION FRAZIER HANDLE 10FR (MISCELLANEOUS) ×2
SUCTION TUBE FRAZIER 10FR DISP (MISCELLANEOUS) ×1 IMPLANT
SUPPORT SCROTAL LG STRP (MISCELLANEOUS) ×2 IMPLANT
SUPPORTER ATHLETIC LG (MISCELLANEOUS) ×1
SUT CHROMIC 3 0 PS 2 (SUTURE) IMPLANT
SUT CHROMIC 4 0 RB 1X27 (SUTURE) IMPLANT
SUT ETHILON NAB PS2 4-0 18IN (SUTURE) IMPLANT
SUT VIC AB 3-0 SH 27 (SUTURE)
SUT VIC AB 3-0 SH 27X BRD (SUTURE) IMPLANT
SYR 10ML LL (SYRINGE) ×3 IMPLANT

## 2019-11-20 NOTE — Op Note (Signed)
Date of procedure: 11/20/19  Preoperative diagnosis:  1. Desire for sterilization  Postoperative diagnosis:  1. Same  Procedure: 1. Vasectomy  Surgeon: Legrand Rams, MD  Anesthesia: General  Complications: None  Intraoperative findings:  1.  Uncomplicated bilateral vasectomy  EBL: 0 mL  Specimens: None  Drains: None  Indication: Ethan Gomez is a 34 y.o. patient who desired vasectomy for permanent sterilization.  He has morbid obesity and has not tolerated procedures under local anesthetic in the clinic previously and requested it be done in the operating room.  After reviewing the management options for treatment, they elected to proceed with the above surgical procedure(s). We have discussed the potential benefits and risks of the procedure, side effects of the proposed treatment, the likelihood of the patient achieving the goals of the procedure, and any potential problems that might occur during the procedure or recuperation. Informed consent has been obtained.  Description of procedure:  The patient was taken to the operating room and general anesthesia was induced. SCDs were placed for DVT prophylaxis. The patient was placed in the supine position, prepped and draped in the usual sterile fashion, and preoperative antibiotics(Ancef) were administered. A preoperative time-out was performed.   The right vas deferens was brought up to the skin of the right upper scrotum. The skin overlying it was anesthetized with 1% lidocaine without epinephrine, anesthetic was also injected alongside the vas deferens in the direction of the inguinal canal. The no scalpel vasectomy instrument was used to make a small perforation in the scrotal skin. The vasectomy clamp was used to grasp the vas deferens. It was carefully dissected free from surrounding structures. A 1cm segment of the vas was removed, and the cut ends of the mucosa were cauterized. No significant bleeding was noted. The vas  deferens was returned to the scrotum. The skin incision was closed with a simple interrupted stitch of 4-0 chromic.  Attention was then turned to the left side. The left vasectomy was performed in the same exact fashion. Sterile dressings were placed over each incision. The patient tolerated the procedure well.  Fluffs and a jockstrap were placed.  Disposition: Stable to PACU  Plan: Semen sample in clinic in 3 months to confirm sterility  Legrand Rams, MD

## 2019-11-20 NOTE — Anesthesia Procedure Notes (Signed)
Procedure Name: Intubation Date/Time: 11/20/2019 10:51 AM Performed by: Gentry Fitz, CRNA Pre-anesthesia Checklist: Patient identified, Emergency Drugs available, Suction available and Patient being monitored Patient Re-evaluated:Patient Re-evaluated prior to induction Oxygen Delivery Method: Circle system utilized Preoxygenation: Pre-oxygenation with 100% oxygen Induction Type: IV induction Ventilation: Mask ventilation without difficulty Laryngoscope Size: Mac and 4 Grade View: Grade I Tube type: Oral Tube size: 7.5 mm Number of attempts: 1 Airway Equipment and Method: Stylet Secured at: 23 cm Tube secured with: Tape Dental Injury: Teeth and Oropharynx as per pre-operative assessment

## 2019-11-20 NOTE — Anesthesia Preprocedure Evaluation (Signed)
Anesthesia Evaluation  Patient identified by MRN, date of birth, ID band Patient awake    Reviewed: Allergy & Precautions, H&P , NPO status , Patient's Chart, lab work & pertinent test results, reviewed documented beta blocker date and time   History of Anesthesia Complications Negative for: history of anesthetic complications  Airway Mallampati: I  TM Distance: >3 FB Neck ROM: full    Dental  (+) Dental Advidsory Given, Teeth Intact   Pulmonary neg pulmonary ROS,    Pulmonary exam normal breath sounds clear to auscultation       Cardiovascular Exercise Tolerance: Good negative cardio ROS Normal cardiovascular exam Rhythm:regular Rate:Normal     Neuro/Psych negative neurological ROS  negative psych ROS   GI/Hepatic Neg liver ROS, GERD  ,  Endo/Other  neg diabetesMorbid obesity  Renal/GU negative Renal ROS  negative genitourinary   Musculoskeletal   Abdominal   Peds  Hematology negative hematology ROS (+)   Anesthesia Other Findings Past Medical History: No date: Acid reflux     Comment:  OCC-TUMS PRN 12/2014: Calcaneal spur of left foot No date: Family history of adverse reaction to anesthesia     Comment:  pt's mother has hx. of post-op N/V No date: Migraines     Comment:  MIGRAINES 12/2014: Rupture of tendon     Comment:  left ankle and foot   Reproductive/Obstetrics negative OB ROS                             Anesthesia Physical Anesthesia Plan  ASA: III  Anesthesia Plan: General   Post-op Pain Management:    Induction: Intravenous  PONV Risk Score and Plan: 2 and Ondansetron, Dexamethasone, Promethazine and Midazolam  Airway Management Planned: Oral ETT and LMA  Additional Equipment:   Intra-op Plan:   Post-operative Plan: Extubation in OR  Informed Consent: I have reviewed the patients History and Physical, chart, labs and discussed the procedure including the  risks, benefits and alternatives for the proposed anesthesia with the patient or authorized representative who has indicated his/her understanding and acceptance.     Dental Advisory Given  Plan Discussed with: Anesthesiologist, CRNA and Surgeon  Anesthesia Plan Comments:         Anesthesia Quick Evaluation

## 2019-11-20 NOTE — H&P (Signed)
UROLOGY H&P UPDATE  Agree with prior H&P dated 10/20/2019.  Cardiac: RRR Lungs: CTA bilaterally  Laterality: Bilateral Procedure: Vasectomy  We discussed the risks and benefits of vasectomy at length.  Vasectomy is intended to be a permanent form of contraception, and does not produce immediate sterility.  Following vasectomy another form of contraception is required until vas occlusion is confirmed by a post-vasectomy semen analysis obtained 2-3 months after the procedure.  Even after vas occlusion is confirmed, vasectomy is not 100% reliable in preventing pregnancy, and the failure rate is approximately 06/1998.  Repeat vasectomy is required in less than 1% of patients.  He should refrain from ejaculation for 1 week after vasectomy.  Options for fertility after vasectomy include vasectomy reversal, and sperm retrieval with in vitro fertilization or ICSI.  These options are not always successful and may be expensive.  Finally, there are other permanent and non-permanent alternatives to vasectomy available. There is no risk of erectile dysfunction, and the volume of semen will be similar to prior, as the majority of the ejaculate is from the prostate and seminal vesicles.   The complication rate is approximately 1-2%, and includes bleeding, infection, and development of chronic scrotal pain.   Sondra Come, MD 11/20/2019

## 2019-11-20 NOTE — Discharge Instructions (Signed)

## 2019-11-20 NOTE — Transfer of Care (Signed)
Immediate Anesthesia Transfer of Care Note  Patient: Ethan Gomez  Procedure(s) Performed: VASECTOMY (N/A Scrotum)  Patient Location: PACU  Anesthesia Type:General  Level of Consciousness: awake, drowsy and patient cooperative  Airway & Oxygen Therapy: Patient Spontanous Breathing and Patient connected to face mask oxygen  Post-op Assessment: Report given to RN and Post -op Vital signs reviewed and stable  Post vital signs: Reviewed and stable  Last Vitals:  Vitals Value Taken Time  BP 125/83 11/20/19 1127  Temp    Pulse 74 11/20/19 1128  Resp 16 11/20/19 1128  SpO2 98 % 11/20/19 1128  Vitals shown include unvalidated device data.  Last Pain:  Vitals:   11/20/19 1125  TempSrc:   PainSc: (P) 0-No pain         Complications: No apparent anesthesia complications

## 2019-11-20 NOTE — Anesthesia Postprocedure Evaluation (Signed)
Anesthesia Post Note  Patient: Ethan Gomez  Procedure(s) Performed: VASECTOMY (N/A Scrotum)  Patient location during evaluation: PACU Anesthesia Type: General Level of consciousness: awake and alert Pain management: pain level controlled Vital Signs Assessment: post-procedure vital signs reviewed and stable Respiratory status: spontaneous breathing, nonlabored ventilation, respiratory function stable and patient connected to nasal cannula oxygen Cardiovascular status: blood pressure returned to baseline and stable Postop Assessment: no apparent nausea or vomiting Anesthetic complications: no     Last Vitals:  Vitals:   11/20/19 1230 11/20/19 1240  BP: 117/80 120/85  Pulse: (!) 59 66  Resp: 15 18  Temp: (!) 36 C (!) 36.3 C  SpO2: 93% 97%    Last Pain:  Vitals:   11/20/19 1240  TempSrc: Temporal  PainSc: 2                  Lenard Simmer

## 2019-12-01 ENCOUNTER — Other Ambulatory Visit: Payer: Self-pay

## 2019-12-01 ENCOUNTER — Ambulatory Visit: Payer: Medicaid Other | Admitting: Adult Health

## 2019-12-04 ENCOUNTER — Telehealth: Payer: Self-pay

## 2019-12-04 NOTE — Telephone Encounter (Signed)
Confirmed appointment on 12/08/2019 and screened for covid. [facility]  

## 2019-12-08 ENCOUNTER — Ambulatory Visit: Payer: Medicaid Other | Admitting: Adult Health

## 2019-12-17 ENCOUNTER — Telehealth: Payer: Self-pay

## 2019-12-17 NOTE — Telephone Encounter (Signed)
BILLED MISSED APPOINTMENT FEE 12/08/2019 

## 2020-02-10 ENCOUNTER — Other Ambulatory Visit: Payer: Self-pay

## 2020-02-10 DIAGNOSIS — Z3009 Encounter for other general counseling and advice on contraception: Secondary | ICD-10-CM

## 2020-02-12 ENCOUNTER — Other Ambulatory Visit: Payer: Self-pay

## 2020-02-12 ENCOUNTER — Other Ambulatory Visit: Payer: Medicaid Other

## 2020-02-16 ENCOUNTER — Other Ambulatory Visit: Payer: Medicaid Other

## 2020-02-16 ENCOUNTER — Other Ambulatory Visit: Payer: Self-pay

## 2020-02-16 DIAGNOSIS — Z3009 Encounter for other general counseling and advice on contraception: Secondary | ICD-10-CM

## 2020-02-18 ENCOUNTER — Telehealth: Payer: Self-pay

## 2020-02-18 LAB — POST-VAS SPERM EVALUATION,QUAL: Volume: 2.1 mL

## 2020-02-18 NOTE — Telephone Encounter (Signed)
Called pt informed him of the information below. Pt gave verbal understanding.  

## 2020-02-18 NOTE — Telephone Encounter (Signed)
-----   Message from Sondra Come, MD sent at 02/18/2020  8:05 AM EDT ----- No sperm seen, ok to discontinue alternative contraception  Legrand Rams, MD 02/18/2020

## 2020-03-07 ENCOUNTER — Encounter: Payer: Medicaid Other | Admitting: Adult Health

## 2020-04-07 ENCOUNTER — Encounter: Payer: Medicaid Other | Admitting: Hospice and Palliative Medicine

## 2020-04-07 ENCOUNTER — Telehealth: Payer: Self-pay

## 2020-04-07 NOTE — Telephone Encounter (Signed)
Lmom to reschedule missed ov on 04-07-20. Toni Amend

## 2020-08-29 ENCOUNTER — Other Ambulatory Visit: Payer: Self-pay

## 2020-08-29 ENCOUNTER — Encounter: Payer: Self-pay | Admitting: Physician Assistant

## 2020-08-29 ENCOUNTER — Ambulatory Visit (INDEPENDENT_AMBULATORY_CARE_PROVIDER_SITE_OTHER): Payer: Medicaid Other | Admitting: Physician Assistant

## 2020-08-29 DIAGNOSIS — M25571 Pain in right ankle and joints of right foot: Secondary | ICD-10-CM

## 2020-08-29 DIAGNOSIS — R030 Elevated blood-pressure reading, without diagnosis of hypertension: Secondary | ICD-10-CM

## 2020-08-29 NOTE — Progress Notes (Signed)
[facility] [address] [place], [place] [address]  Internal MEDICINE  Office Visit Note  Patient Name: Ethan Gomez  [id number]  [id number]  Date of Service: 08/29/2020  Chief Complaint  Patient presents with  . Pain    Right ankle  need referral for orthopedic    . Gastroesophageal Reflux     HPI Pt is here for a sick visit. On Friday he was standing and turned rapidly when his name was called and had searing pain shoot through R ankle. He felt a pop along back, and outside of foot. He has previosuly ruptured tendon and had bone spur on L ankle, but nothing on R. He does state that when he did this on L he did not realize for a long time that the tendon was ruptured and never had any bruising of any signs other than min swelling off and on. This time is much worse. Stiffness in R ankle and is sing crutch now to get around. He cannot drive bc he cant bend his foot at all. He can wt bear if he has to but it is painful. He cannot activley dorsiflex and very tender to touch that even passive movement is limited to pain. He has tried tylenol and ibuprofen with minimal help. It has gotten progressively worse since Friday and is now swollen on both sides of his ankle. No bruising present. BP is high currently likely due to his pain. Will need to monitor once acute injury addressed by emergortho.  Current Medication:  Outpatient Encounter Medications as of 08/29/2020  Medication Sig  . calcium carbonate (TUMS - DOSED IN MG ELEMENTAL CALCIUM) 500 MG chewable tablet Chew 1 tablet by mouth as needed for indigestion or heartburn.  . cetirizine (ZYRTEC) 10 MG tablet Take 10 mg by mouth at bedtime.   . Multiple Vitamin (MULTIVITAMIN) tablet Take 1 tablet by mouth daily.  . [DISCONTINUED] Galcanezumab-gnlm (EMGALITY) 120 MG/ML SOAJ Inject 120 mg into the skin every 30 (thirty) days. (Patient not taking: No sig reported)  . [DISCONTINUED] methocarbamol (ROBAXIN) 500 MG tablet Take 500  mg by mouth every 6 (six) hours as needed for muscle spasms.  . [DISCONTINUED] traMADol (ULTRAM) 50 MG tablet Take 100 mg by mouth 3 (three) times daily as needed for moderate pain.   No facility-administered encounter medications on file as of 08/29/2020.      Medical History: Past Medical History:  Diagnosis Date  . Acid reflux    OCC-TUMS PRN  . Calcaneal spur of left foot 12/2014  . Family history of adverse reaction to anesthesia    pt's mother has hx. of post-op N/V  . Migraines    MIGRAINES  . Rupture of tendon 12/2014   left ankle and foot     Vital Signs: BP (!) 160/90   Pulse 60   Temp 97.8 F (36.6 C)   Resp 16   Ht 6\' 6"  (1.981 m)   Wt (!) 363 lb (164.7 kg)   SpO2 96%   BMI 41.95 kg/m    Review of Systems  Constitutional: Negative for fatigue and fever.  HENT: Negative for congestion, mouth sores and postnasal drip.   Respiratory: Negative for cough.   Cardiovascular: Negative for chest pain.  Genitourinary: Negative for flank pain.  Musculoskeletal: Positive for arthralgias, gait problem and joint swelling.       Decreased ROM, pain, and swelling in R ankle. Heard pop on twisting on Friday and is getting worse. Walking with a  crutch now  Skin: Negative for color change.  Psychiatric/Behavioral: Negative.     Physical Exam Constitutional:      General: He is not in acute distress.    Appearance: He is well-developed. He is obese. He is not diaphoretic.  HENT:     Head: Normocephalic and atraumatic.     Mouth/Throat:     Pharynx: No oropharyngeal exudate.  Eyes:     Pupils: Pupils are equal, round, and reactive to light.  Neck:     Thyroid: No thyromegaly.     Vascular: No JVD.     Trachea: No tracheal deviation.  Cardiovascular:     Rate and Rhythm: Normal rate and regular rhythm.     Heart sounds: Normal heart sounds. No murmur heard. No friction rub. No gallop.   Pulmonary:     Effort: Pulmonary effort is normal. No respiratory distress.      Breath sounds: No wheezing or rales.  Chest:     Chest wall: No tenderness.  Abdominal:     General: Bowel sounds are normal.     Palpations: Abdomen is soft.  Musculoskeletal:        General: Swelling, tenderness and signs of injury present.     Cervical back: Normal range of motion and neck supple.     Comments: R ankle is swollen medially and laterally. Very tender to light touch. Decreased active and passive ROM secondary to pain. No bruising or discoloration. Able to wiggle toes, but otherwise not much movement.  Lymphadenopathy:     Cervical: No cervical adenopathy.  Skin:    General: Skin is warm and dry.     Coloration: Skin is not pale.     Findings: No bruising, erythema or lesion.  Neurological:     Mental Status: He is alert and oriented to person, place, and time.     Cranial Nerves: No cranial nerve deficit.  Psychiatric:        Behavior: Behavior normal.        Thought Content: Thought content normal.        Judgment: Judgment normal.       Assessment/Plan: 1. Acute right ankle pain Worsening R ankle pain, with reduced ROM, concern for tendon rupture. Also concern that he may be at risk for development of DVT due to lack of movement. Currently able to palpate pulses however somewhat diminished secondary to swelling and acute tenderness on exam. Will send urgent referral to [facility] for imaging and further workup. - AMB referral to orthopedics  2. Elevated BP without diagnosis of hypertension BP elevated today likely secondary to pain. Will continue to monitor as acute injury is addressed.   General Counseling: pratik dalziel understanding of the findings of todays visit and agrees with plan of treatment. I have discussed any further diagnostic evaluation that may be needed or ordered today. We also reviewed his medications today. he has been encouraged to call the office with any questions or concerns that should arise related to todays  visit.    Counseling:    Orders Placed This Encounter  Procedures  . AMB referral to orthopedics    No orders of the defined types were placed in this encounter.   Time spent:20 Minutes

## 2020-11-16 ENCOUNTER — Other Ambulatory Visit: Payer: Self-pay | Admitting: Adult Health

## 2020-11-16 DIAGNOSIS — G8929 Other chronic pain: Secondary | ICD-10-CM

## 2020-12-27 ENCOUNTER — Encounter: Payer: Self-pay | Admitting: Internal Medicine

## 2020-12-27 ENCOUNTER — Ambulatory Visit: Payer: Medicaid Other | Admitting: Internal Medicine

## 2020-12-27 ENCOUNTER — Other Ambulatory Visit: Payer: Self-pay

## 2020-12-27 VITALS — BP 124/92 | HR 82 | Temp 97.7°F | Resp 16 | Ht 78.0 in | Wt 362.6 lb

## 2020-12-27 DIAGNOSIS — M79671 Pain in right foot: Secondary | ICD-10-CM | POA: Diagnosis not present

## 2020-12-27 MED ORDER — TRAMADOL HCL 50 MG PO TABS: 50.0000 mg | ORAL_TABLET | Freq: Four times a day (QID) | ORAL | 0 refills | Status: DC | PRN

## 2020-12-27 NOTE — Progress Notes (Signed)
[facility] [address] [place], [place] [address]  Internal MEDICINE  Office Visit Note  Patient Name: Ethan Gomez  [id number]  [id number]  Date of Service: 01/03/2021  Chief Complaint  Patient presents with   Acute Visit    Possible broken toes, 3 middle toes on right foot     HPI  Patient is here for acute visit. Complains about pain in his foot on the right side he stumbled over diaper bag and is unable to move the middle toes due to pain   Current Medication: Outpatient Encounter Medications as of 12/27/2020  Medication Sig   cetirizine (ZYRTEC) 10 MG tablet Take 10 mg by mouth at bedtime.    meloxicam (MOBIC) 15 MG tablet Take 15 mg by mouth daily.   Multiple Vitamin (MULTIVITAMIN) tablet Take 1 tablet by mouth daily.   traMADol (ULTRAM) 50 MG tablet Take 1 tablet (50 mg total) by mouth every 6 (six) hours as needed for up to 7 days.   [DISCONTINUED] calcium carbonate (TUMS - DOSED IN MG ELEMENTAL CALCIUM) 500 MG chewable tablet Chew 1 tablet by mouth as needed for indigestion or heartburn. (Patient not taking: Reported on 12/27/2020)   No facility-administered encounter medications on file as of 12/27/2020.    Surgical History: Past Surgical History:  Procedure Laterality Date   BONE EXOSTOSIS EXCISION Left 01/27/2015   Procedure: EXCISION LEFT HEEL OS CALCANEAL SPUR ;  Surgeon: Loreta Ave, MD;  Location: [facility];  Service: Orthopedics;  Laterality: Left;  ANESTHESIA: GENERAL, BLOCK   left ankle surgery     TENDON REPAIR Left 01/27/2015   Procedure: REPAIR LEFT PERONEUS LONGUS TENDON;  Surgeon: Loreta Ave, MD;  Location: [facility];  Service: Orthopedics;  Laterality: Left;   VASECTOMY N/A 11/20/2019   Procedure: VASECTOMY;  Surgeon: Sondra Come, MD;  Location: [facility];  Service: Urology;  Laterality: N/A;    Medical History: Past Medical History:  Diagnosis Date   Acid reflux    OCC-TUMS PRN   Calcaneal spur  of left foot 12/2014   Family history of adverse reaction to anesthesia    pt's mother has hx. of post-op N/V   Migraines    MIGRAINES   Rupture of tendon 12/2014   left ankle and foot    Family History: Family History  Problem Relation Age of Onset   Anesthesia problems Mother        post-op N/V   Arthritis Mother    Hyperlipidemia Mother    Hypertension Mother    Arthritis Other        all 4 of his grandparents   Hyperlipidemia Other        all grandparents   Hypertension Maternal Grandmother    Hypertension Paternal Grandfather    Diabetes Maternal Grandfather     Social History   Socioeconomic History   Marital status: Married    Spouse name: Not on file   Number of children: Not on file   Years of education: Not on file   Highest education level: Not on file  Occupational History   Not on file  Tobacco Use   Smoking status: Never   Smokeless tobacco: Former   Tobacco comments:    no smokeless tobacco since age 54  Vaping Use   Vaping Use: Never used  Substance and Sexual Activity   Alcohol use: Yes    Alcohol/week: 0.0 standard drinks    Comment: rare   Drug  use: No   Sexual activity: Yes  Other Topics Concern   Not on file  Social History Narrative   Not on file   Social Determinants of Health   Financial Resource Strain: Not on file  Food Insecurity: Not on file  Transportation Needs: Not on file  Physical Activity: Not on file  Stress: Not on file  Social Connections: Not on file  Intimate Partner Violence: Not on file      Review of Systems  Constitutional:  Negative for chills, fatigue and unexpected weight change.  HENT:  Negative for congestion.   Eyes:  Negative for redness.  Gastrointestinal:  Negative for blood in stool.  Musculoskeletal:  Positive for joint swelling. Negative for arthralgias, back pain and neck pain.  Skin:  Positive for color change. Negative for rash.  Neurological: Negative.   Hematological:  Bruises/bleeds  easily.  Psychiatric/Behavioral:  Negative for behavioral problems (Depression). The patient is not nervous/anxious.    Vital Signs: BP (!) 124/92   Pulse 82   Temp 97.7 F (36.5 C)   Resp 16   Ht 6\' 6"  (1.981 m)   Wt (!) 362 lb 9.6 oz (164.5 kg)   SpO2 98%   BMI 41.90 kg/m    Physical Exam Constitutional:      Appearance: Normal appearance.  Musculoskeletal:        General: Swelling, tenderness, deformity and signs of injury present.     Comments: There is bruising of the right foot, in ability to move second third and fourth toe  Neurological:     Mental Status: He is alert.       Assessment/Plan: 1. Right foot pain Decreased range of motion due to pain patient is to see Ortho soon as possible - meloxicam (MOBIC) 15 MG tablet; Take 15 mg by mouth daily. - traMADol (ULTRAM) 50 MG tablet; Take 1 tablet (50 mg total) by mouth every 6 (six) hours as needed for up to 7 days.  Dispense: 21 tablet; Refill: 0 - Ambulatory referral to Orthopedic Surgery   General Counseling: brandn mcgath understanding of the findings of todays visit and agrees with plan of treatment. I have discussed any further diagnostic evaluation that may be needed or ordered today. We also reviewed his medications today. he has been encouraged to call the office with any questions or concerns that should arise related to todays visit.    Orders Placed This Encounter  Procedures   Ambulatory referral to Orthopedic Surgery    Meds ordered this encounter  Medications   traMADol (ULTRAM) 50 MG tablet    Sig: Take 1 tablet (50 mg total) by mouth every 6 (six) hours as needed for up to 7 days.    Dispense:  21 tablet    Refill:  0    Total time spent:15 Minutes Time spent includes review of chart, medications, test results, and follow up plan with the patient.   [place] Controlled Substance Database was reviewed by me.   Dr Randalyn Rhea Internal medicine

## 2021-01-03 ENCOUNTER — Other Ambulatory Visit: Payer: Self-pay | Admitting: Internal Medicine

## 2021-01-03 NOTE — Telephone Encounter (Signed)
Please review and fill if appropriate LOV: 12/27/20 NOV: none

## 2021-01-13 ENCOUNTER — Other Ambulatory Visit: Payer: Self-pay | Admitting: Internal Medicine

## 2021-01-13 NOTE — Telephone Encounter (Signed)
Please review and fill if appropriate. LOV: 12/27/20 NOV: none

## 2021-02-14 ENCOUNTER — Telehealth: Payer: Self-pay

## 2021-02-14 NOTE — Telephone Encounter (Signed)
Left vm for patient to return my call regarding ortho referral-Ethan Gomez

## 2021-03-16 ENCOUNTER — Ambulatory Visit: Payer: Medicaid Other | Admitting: Physician Assistant

## 2021-03-16 DIAGNOSIS — Z0289 Encounter for other administrative examinations: Secondary | ICD-10-CM

## 2022-02-05 ENCOUNTER — Ambulatory Visit (INDEPENDENT_AMBULATORY_CARE_PROVIDER_SITE_OTHER): Payer: BC Managed Care – PPO | Admitting: Physician Assistant

## 2022-02-05 ENCOUNTER — Encounter: Payer: Self-pay | Admitting: Physician Assistant

## 2022-02-05 VITALS — BP 126/86 | HR 63 | Temp 97.8°F | Resp 16 | Ht 78.0 in | Wt 366.0 lb

## 2022-02-05 DIAGNOSIS — G43111 Migraine with aura, intractable, with status migrainosus: Secondary | ICD-10-CM

## 2022-02-05 DIAGNOSIS — G43109 Migraine with aura, not intractable, without status migrainosus: Secondary | ICD-10-CM

## 2022-02-05 MED ORDER — TOPIRAMATE 25 MG PO TABS: 25.0000 mg | ORAL_TABLET | Freq: Two times a day (BID) | ORAL | 1 refills | Status: DC

## 2022-02-05 NOTE — Progress Notes (Signed)
[facility] [address] [place], [place] [address]  Internal MEDICINE  Office Visit Note  Patient Name: Ethan Gomez  [id number]  [id number]  Date of Service: 02/13/2022  Chief Complaint  Patient presents with   Acute Visit   Migraine     HPI Pt is here for a sick visit. -Day 3 of constant migraine, usually when lasting this long his left eye will start getting blurred--hasnt gotten blurry yet but thinks it is about to get to that point -has been having 2-3 migraines per week, gets visual floaters, light and sound sensitivity. No N/V. He has a long history of migraines but they had gotten better and hasnt been taking anything recently. -Had leftover methocarbamol and tried that, has taken tylenol and ibuprofen without any relief -Used to do emgality injections, and seemed to work well, but trying to avoid injections -was on topamax as preventative and seemed to help some with intensity, but unsure exactly how well. This was awhile ago -has tried [place] and second dosing did knock out migraine in past. Has not tried nurtec. Imitrex gave him nerve pain previously. -Not sleeping very well. Did have sleep study 14 years ago. -Has done amitriptyline in the past as well -used to go to headache clinic a long time ago -Thinks recent worsening is due to added stress, his son was diagnosed with cancer  Current Medication:  Outpatient Encounter Medications as of 02/05/2022  Medication Sig   cetirizine (ZYRTEC) 10 MG tablet Take 10 mg by mouth at bedtime.    meloxicam (MOBIC) 15 MG tablet Take 15 mg by mouth daily.   Multiple Vitamin (MULTIVITAMIN) tablet Take 1 tablet by mouth daily.   topiramate (TOPAMAX) 25 MG tablet Take 1 tablet (25 mg total) by mouth 2 (two) times daily.   No facility-administered encounter medications on file as of 02/05/2022.      Medical History: Past Medical History:  Diagnosis Date   Acid reflux    OCC-TUMS PRN   Calcaneal spur of left  foot 12/2014   Family history of adverse reaction to anesthesia    pt's mother has hx. of post-op N/V   Migraines    MIGRAINES   Rupture of tendon 12/2014   left ankle and foot     Vital Signs: BP 126/86   Pulse 63   Temp 97.8 F (36.6 C)   Resp 16   Ht 6\' 6"  (1.981 m)   Wt (!) 366 lb (166 kg)   SpO2 94%   BMI 42.30 kg/m    Review of Systems  Constitutional:  Negative for fatigue and fever.  HENT:  Negative for congestion, mouth sores and postnasal drip.   Eyes:  Positive for photophobia. Negative for visual disturbance.  Respiratory:  Negative for cough.   Cardiovascular:  Negative for chest pain.  Genitourinary:  Negative for flank pain.  Neurological:  Positive for headaches. Negative for syncope.  Psychiatric/Behavioral:  Positive for sleep disturbance.     Physical Exam Vitals and nursing note reviewed.  Constitutional:      General: He is not in acute distress.    Appearance: Normal appearance. He is well-developed. He is obese. He is not diaphoretic.  HENT:     Head: Normocephalic and atraumatic.     Mouth/Throat:     Pharynx: No oropharyngeal exudate.  Eyes:     Pupils: Pupils are equal, round, and reactive to light.  Neck:     Thyroid: No thyromegaly.  Vascular: No JVD.     Trachea: No tracheal deviation.  Cardiovascular:     Rate and Rhythm: Normal rate and regular rhythm.     Heart sounds: Normal heart sounds. No murmur heard.    No friction rub. No gallop.  Pulmonary:     Effort: Pulmonary effort is normal. No respiratory distress.     Breath sounds: No wheezing or rales.  Chest:     Chest wall: No tenderness.  Musculoskeletal:        General: Normal range of motion.     Cervical back: Normal range of motion and neck supple.  Lymphadenopathy:     Cervical: No cervical adenopathy.  Skin:    General: Skin is warm and dry.  Neurological:     Mental Status: He is alert and oriented to person, place, and time.     Cranial Nerves: No cranial  nerve deficit.  Psychiatric:        Behavior: Behavior normal.        Thought Content: Thought content normal.        Judgment: Judgment normal.       Assessment/Plan: 1. Intractable migraine with aura with status migrainosus Will start back on topamax once daily for 1 week then may increase to BID. Given ubrelvy and nurtec samples to try and advised on how to use these and not to combine. Patient will call if script for one of these desired. Advised to go to ED if acute worsening. - topiramate (TOPAMAX) 25 MG tablet; Take 1 tablet (25 mg total) by mouth 2 (two) times daily.  Dispense: 60 tablet; Refill: 1   General Counseling: Tyreak verbalizes understanding of the findings of todays visit and agrees with plan of treatment. I have discussed any further diagnostic evaluation that may be needed or ordered today. We also reviewed his medications today. he has been encouraged to call the office with any questions or concerns that should arise related to todays visit.    Counseling:    No orders of the defined types were placed in this encounter.   Meds ordered this encounter  Medications   topiramate (TOPAMAX) 25 MG tablet    Sig: Take 1 tablet (25 mg total) by mouth 2 (two) times daily.    Dispense:  60 tablet    Refill:  1    Time spent:30 Minutes

## 2022-02-21 IMAGING — MR MR LUMBAR SPINE W/O CM
4 of 5 series · 33 of 48 positions shown · non-contrast
Comparison: Prior MRI from 09/24/2016.

CLINICAL DATA: Initial evaluation for low back pain with right
lower extremity pain for 3-4 weeks.

EXAM:
MRI LUMBAR SPINE WITHOUT CONTRAST
TECHNIQUE: Multiplanar, multisequence MR imaging of the lumbar spine was
performed. No intravenous contrast was administered.

[Series 5: T2 · sagittal · 4.0mm · 0.81mm/px · 8 of 17 slices shown (1 of 2)]
[im 1/17]
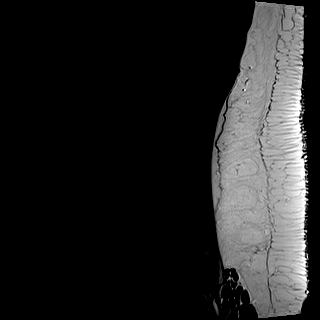
[im 3/17]
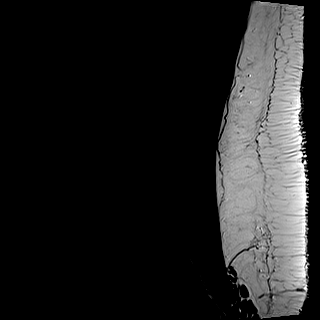
[im 5/17]
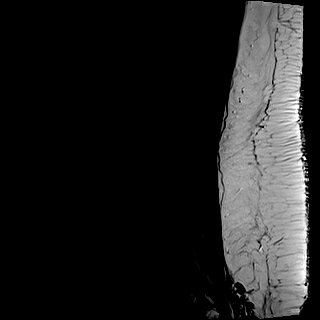
[im 7/17]
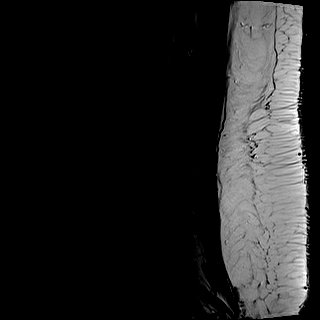
[im 10/17]
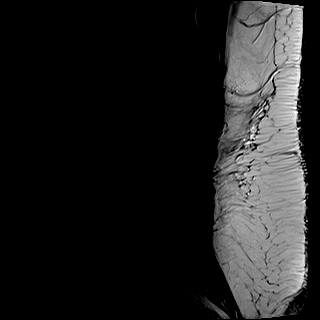
[im 12/17]
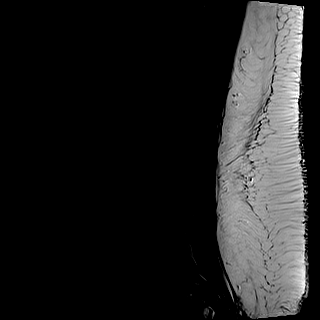
[im 14/17]
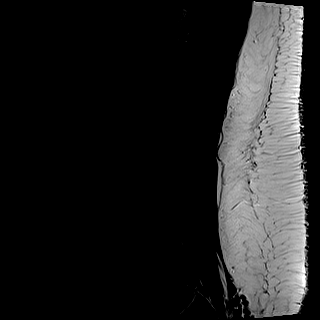
[im 17/17]
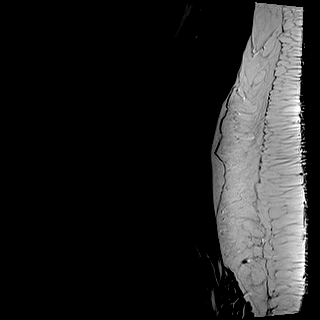

[Series 6: T1 · sagittal · 4.0mm · 0.81mm/px · 7 of 17 slices shown (1 of 2)]
[im 1/17]
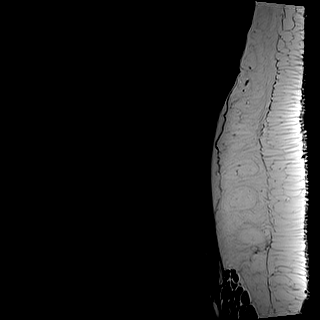
[im 3/17]
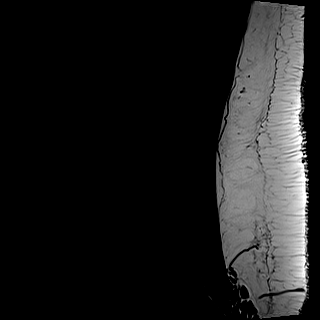
[im 6/17]
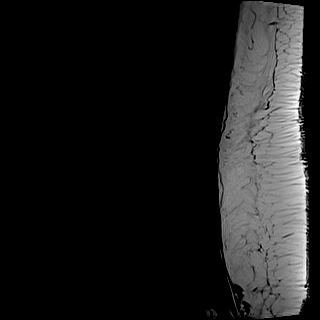
[im 9/17]
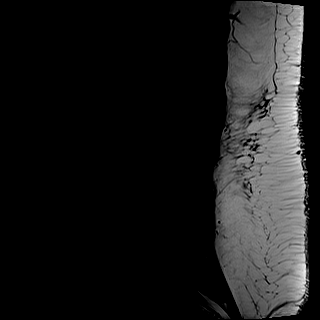
[im 11/17]
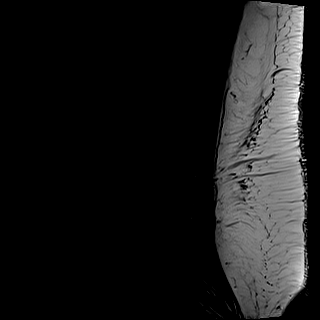
[im 14/17]
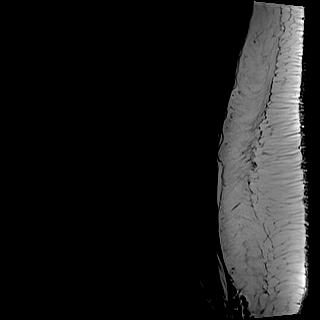
[im 17/17]
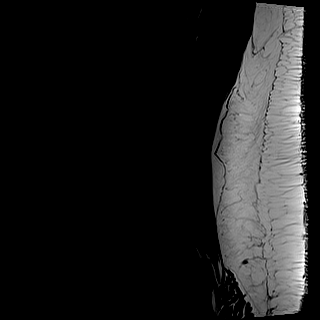

[Series 8: T2 · axial · 4.0mm · 0.78mm/px · z∈[-137,+57]mm · 9 of 31 slices shown (2 of 2)]
[im 1/31]
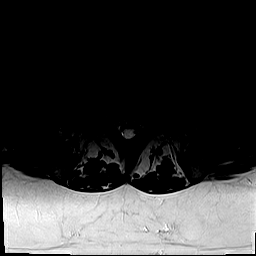
[im 6/31]
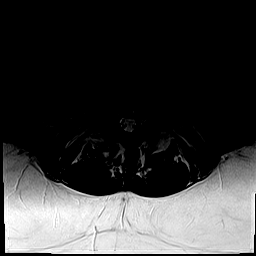
[im 11/31]
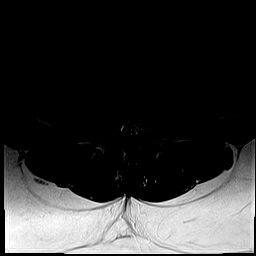
[im 13/31]
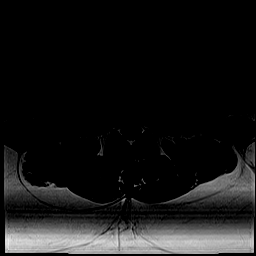
[im 16/31]
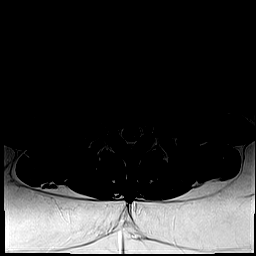
[im 18/31]
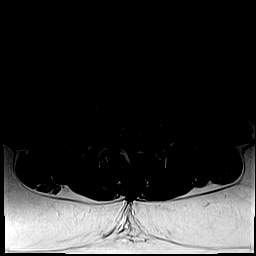
[im 21/31]
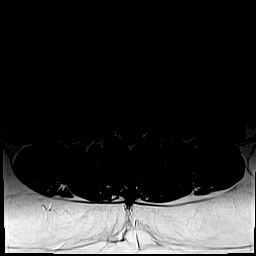
[im 26/31]
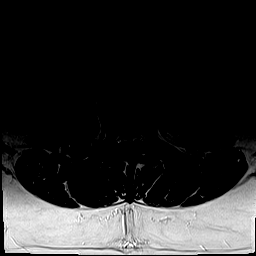
[im 31/31]
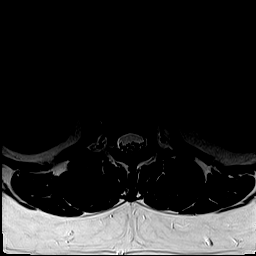

[Series 9: T1 · axial · 4.0mm · 0.39mm/px · z∈[-137,+57]mm · 9 of 31 slices shown (2 of 2)]
[im 1/31]
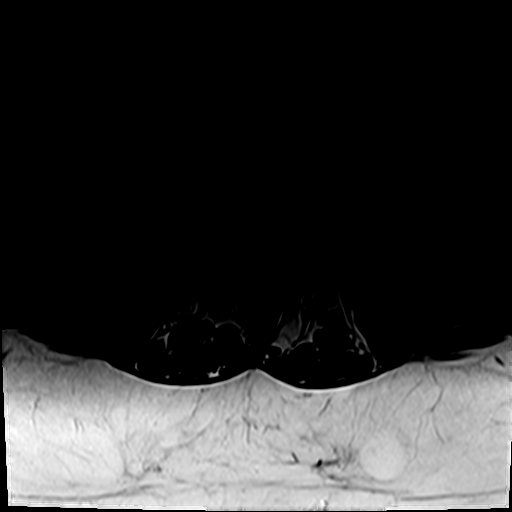
[im 6/31]
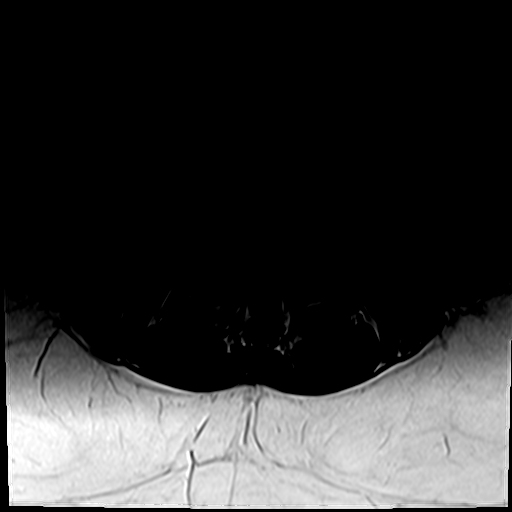
[im 11/31]
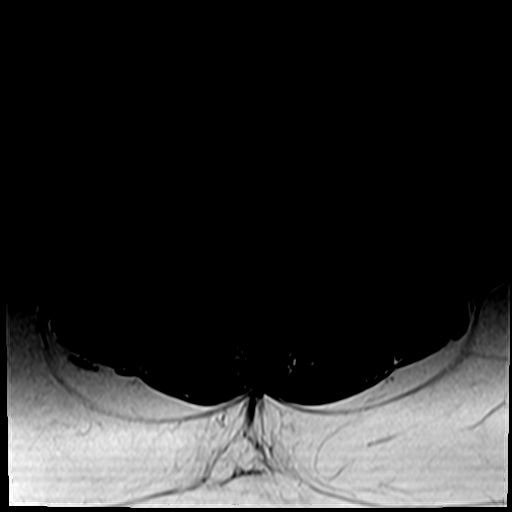
[im 13/31]
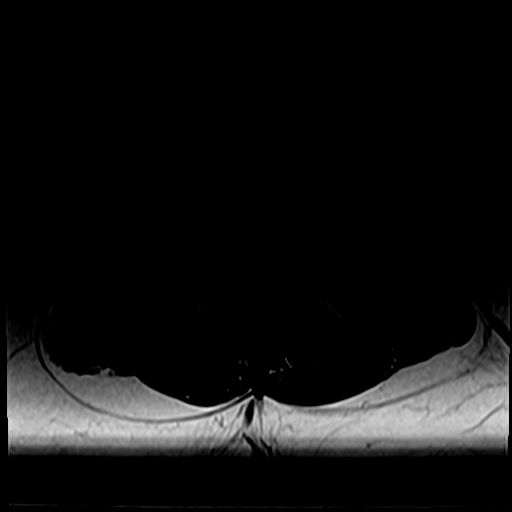
[im 16/31]
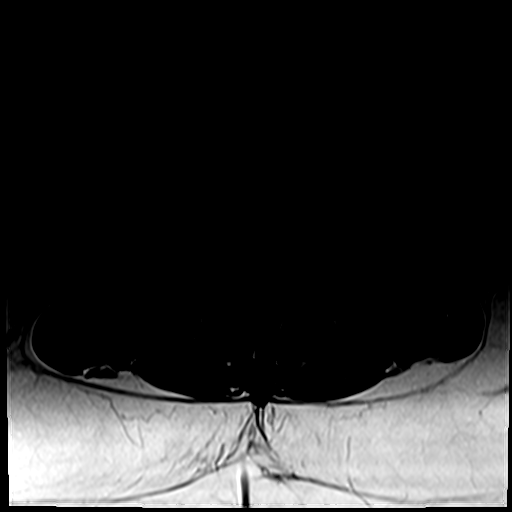
[im 18/31]
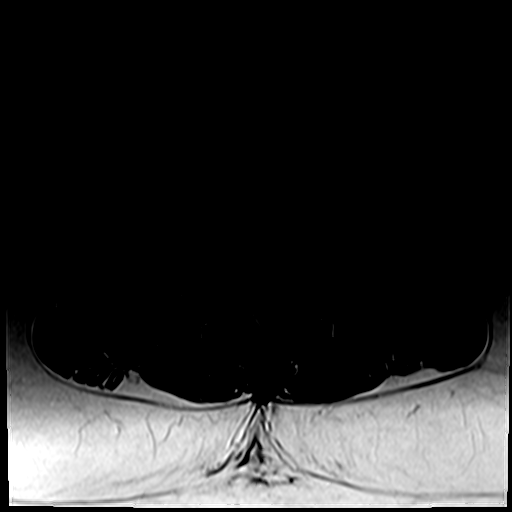
[im 21/31]
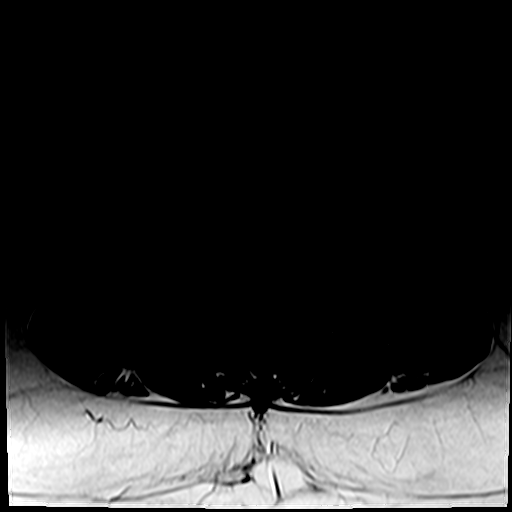
[im 26/31]
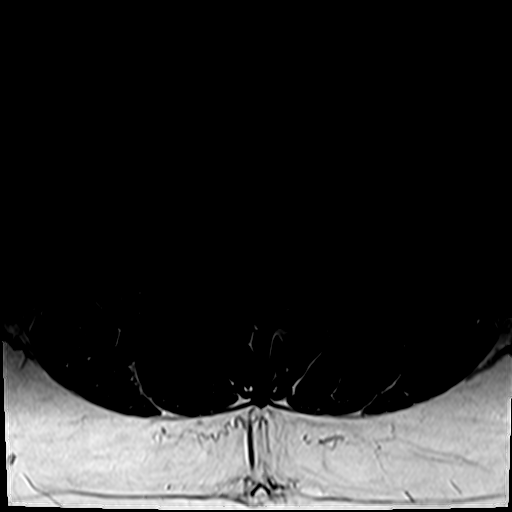
[im 31/31]
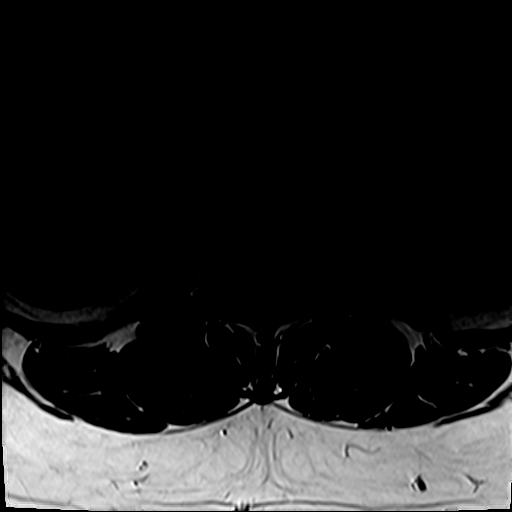

[33 of 48 positions shown; findings below may reference images not displayed]

FINDINGS: Segmentation: Standard. Lowest well-formed disc space labeled the
L5-S1 level.

Alignment: Trace retrolisthesis of L5 on S1. Alignment otherwise
normal with preservation of the normal lumbar lordosis.

Vertebrae: Vertebral body height maintained without evidence for
acute or chronic fracture. Bone marrow signal intensity within
normal limits. Subcentimeter benign hemangioma noted within the L2
vertebral body. No other discrete or worrisome osseous lesions. No
abnormal marrow edema.

Conus medullaris and cauda equina: Conus extends to the L1 level.
Conus and cauda equina appear normal.

Paraspinal and other soft tissues: Paraspinous soft tissues within
normal limits. Visualized visceral structures are normal.

Disc levels:

L1-2: Chronic intervertebral disc space narrowing with diffuse disc
bulge and disc desiccation. Mild reactive endplate changes with
marginal endplate osteophytic spurring. No canal or foraminal
stenosis.

L2-3: Mild diffuse disc bulge with disc desiccation. Mild bilateral
facet hypertrophy. No canal or foraminal stenosis.

L3-4: Normal interspace. Mild to moderate bilateral facet
hypertrophy. No significant spinal stenosis. Foramina remain patent.

L4-5: Mild diffuse disc bulge with disc desiccation. Superimposed
tiny central disc extrusion with superior migration and annular
fissure (series 5, image 9). Mild bilateral facet hypertrophy.
Prominence of the dorsal epidural fat. No significant spinal
stenosis. Foramina remain patent.

L5-S1: Trace retrolisthesis. Mild diffuse disc bulge, eccentric to
the left. Mild bilateral facet hypertrophy. Epidural lipomatosis. No
significant canal or lateral recess stenosis. Foramina remain
patent.
IMPRESSION: 1. Mild multilevel noncompressive disc bulging as above without
significant canal or foraminal stenosis or evidence for neural
impingement. No findings to explain patient's right lower extremity
symptoms identified.
2. Tiny central disc extrusion with annular fissure at L4-5, which
could contribute to lower back pain.

## 2022-02-27 ENCOUNTER — Other Ambulatory Visit: Payer: Self-pay | Admitting: Physician Assistant

## 2022-02-27 DIAGNOSIS — G43111 Migraine with aura, intractable, with status migrainosus: Secondary | ICD-10-CM

## 2022-02-27 NOTE — Telephone Encounter (Signed)
Pt need appt for refills  ?

## 2022-03-08 ENCOUNTER — Ambulatory Visit: Payer: BC Managed Care – PPO | Admitting: Physician Assistant

## 2022-03-31 ENCOUNTER — Other Ambulatory Visit: Payer: Self-pay | Admitting: Physician Assistant

## 2022-03-31 DIAGNOSIS — G43111 Migraine with aura, intractable, with status migrainosus: Secondary | ICD-10-CM

## 2022-06-14 DIAGNOSIS — R519 Headache, unspecified: Secondary | ICD-10-CM | POA: Diagnosis not present

## 2022-06-14 DIAGNOSIS — M9903 Segmental and somatic dysfunction of lumbar region: Secondary | ICD-10-CM | POA: Diagnosis not present

## 2022-06-14 DIAGNOSIS — M9901 Segmental and somatic dysfunction of cervical region: Secondary | ICD-10-CM | POA: Diagnosis not present

## 2022-06-14 DIAGNOSIS — M62830 Muscle spasm of back: Secondary | ICD-10-CM | POA: Diagnosis not present

## 2022-07-23 DIAGNOSIS — R519 Headache, unspecified: Secondary | ICD-10-CM | POA: Diagnosis not present

## 2022-07-23 DIAGNOSIS — M62830 Muscle spasm of back: Secondary | ICD-10-CM | POA: Diagnosis not present

## 2022-07-23 DIAGNOSIS — M9903 Segmental and somatic dysfunction of lumbar region: Secondary | ICD-10-CM | POA: Diagnosis not present

## 2022-07-23 DIAGNOSIS — M9901 Segmental and somatic dysfunction of cervical region: Secondary | ICD-10-CM | POA: Diagnosis not present

## 2022-08-04 DIAGNOSIS — M25512 Pain in left shoulder: Secondary | ICD-10-CM | POA: Diagnosis not present

## 2022-08-13 DIAGNOSIS — M25512 Pain in left shoulder: Secondary | ICD-10-CM | POA: Diagnosis not present

## 2022-09-24 DIAGNOSIS — R519 Headache, unspecified: Secondary | ICD-10-CM | POA: Diagnosis not present

## 2022-09-24 DIAGNOSIS — M62830 Muscle spasm of back: Secondary | ICD-10-CM | POA: Diagnosis not present

## 2022-09-24 DIAGNOSIS — M9903 Segmental and somatic dysfunction of lumbar region: Secondary | ICD-10-CM | POA: Diagnosis not present

## 2022-09-24 DIAGNOSIS — M9901 Segmental and somatic dysfunction of cervical region: Secondary | ICD-10-CM | POA: Diagnosis not present

## 2022-10-08 ENCOUNTER — Ambulatory Visit: Payer: BC Managed Care – PPO | Admitting: Physician Assistant

## 2022-10-08 ENCOUNTER — Encounter: Payer: Self-pay | Admitting: Physician Assistant

## 2022-10-08 VITALS — BP 129/88 | HR 78 | Temp 98.0°F | Resp 16 | Ht 78.0 in | Wt 363.0 lb

## 2022-10-08 DIAGNOSIS — G43111 Migraine with aura, intractable, with status migrainosus: Secondary | ICD-10-CM | POA: Diagnosis not present

## 2022-10-08 DIAGNOSIS — F909 Attention-deficit hyperactivity disorder, unspecified type: Secondary | ICD-10-CM

## 2022-10-08 MED ORDER — AMPHETAMINE-DEXTROAMPHET ER 10 MG PO CP24: 10.0000 mg | ORAL_CAPSULE | Freq: Every day | ORAL | 0 refills | Status: DC

## 2022-10-08 MED ORDER — NURTEC 75 MG PO TBDP: ORAL_TABLET | ORAL | 2 refills | Status: DC

## 2022-10-08 MED ORDER — TOPIRAMATE 25 MG PO TABS: 25.0000 mg | ORAL_TABLET | Freq: Two times a day (BID) | ORAL | 2 refills | Status: DC

## 2022-10-08 NOTE — Progress Notes (Signed)
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 Nurtec samples given last visit and worked well for him. He has tried triptans in the past without success. Will send nurtec script and give another sample in meantime. Topamax had helped decrease the migraine frequency/intensity. Unfortunately he will wake with migraines some days now and abortive meds done work as well when migraine has already set in and therefore needs to get back on preventive medication as well.  -Dx with adhd at age 37, but by age 81 had been able to control it better with some techniques. Since becoming a teacher, his techniques aren't helping by the time he gets home. Able to power through work but then by the time he gets home he is all over the place. Used to take Ritalin and doesn't want to take this again. Realized he has been taking a lot of caffeine with coffee and redbull.   -Does also mention having reduced libido, may be due to mental fatigue and some depression. Does not want to try any meds for this currently. Will monitor and start by addressing migraines and adhd and see how this is affected. May call sooner if needed  Current Medication: Outpatient Encounter Medications as of 10/08/2022  Medication Sig   amphetamine-dextroamphetamine (ADDERALL XR) 10 MG 24 hr capsule Take 1 capsule (10 mg total) by mouth daily.   cetirizine (ZYRTEC) 10 MG tablet Take 10  mg by mouth at bedtime.    meloxicam (MOBIC) 15 MG tablet Take 15 mg by mouth daily.   Multiple Vitamin (MULTIVITAMIN) tablet Take 1 tablet by mouth daily.   Rimegepant Sulfate (NURTEC) 75 MG TBDP Take one tablet by mouth daily as needed for migraine   [DISCONTINUED] topiramate (TOPAMAX) 25 MG tablet TAKE 1 TABLET BY MOUTH TWICE A DAY   topiramate (TOPAMAX) 25 MG tablet Take 1 tablet (25 mg total) by mouth 2 (two) times daily.   No facility-administered encounter medications on file as of 10/08/2022.    Surgical History: Past Surgical History:  Procedure Laterality Date   BONE EXOSTOSIS EXCISION Left 01/27/2015   Procedure: EXCISION LEFT HEEL OS CALCANEAL SPUR ;  Surgeon: Loreta Ave, MD;  Location: [facility];  Service: Orthopedics;  Laterality: Left;  ANESTHESIA: GENERAL, BLOCK   left ankle surgery     TENDON REPAIR Left 01/27/2015   Procedure: REPAIR LEFT PERONEUS LONGUS TENDON;  Surgeon: Loreta Ave, MD;  Location: [facility];  Service: Orthopedics;  Laterality: Left;   VASECTOMY N/A 11/20/2019   Procedure: VASECTOMY;  Surgeon: Sondra Come, MD;  Location: [facility];  Service: Urology;  Laterality: N/A;    Medical History: Past Medical History:  Diagnosis Date   Acid reflux    OCC-TUMS PRN   Calcaneal spur of left foot 12/2014   Family history of adverse reaction to anesthesia    pt's mother has hx.  of post-op N/V   Migraines    MIGRAINES   Rupture of tendon 12/2014   left ankle and foot    Family History: Family History  Problem Relation Age of Onset   Anesthesia problems Mother        post-op N/V   Arthritis Mother    Hyperlipidemia Mother    Hypertension Mother    Arthritis Other        all 4 of his grandparents   Hyperlipidemia Other        all grandparents   Hypertension Maternal Grandmother    Hypertension Paternal Grandfather    Diabetes Maternal Grandfather     Social History   Socioeconomic History   Marital status:  Married    Spouse name: Not on file   Number of children: Not on file   Years of education: Not on file   Highest education level: Not on file  Occupational History   Not on file  Tobacco Use   Smoking status: Never   Smokeless tobacco: Former   Tobacco comments:    no smokeless tobacco since age 25  Vaping Use   Vaping Use: Never used  Substance and Sexual Activity   Alcohol use: Yes    Alcohol/week: 0.0 standard drinks of alcohol    Comment: rare   Drug use: No   Sexual activity: Yes  Other Topics Concern   Not on file  Social History Narrative   Not on file   Social Determinants of Health   Financial Resource Strain: Not on file  Food Insecurity: Not on file  Transportation Needs: Not on file  Physical Activity: Not on file  Stress: Not on file  Social Connections: Not on file  Intimate Partner Violence: Not on file      Review of Systems  Constitutional:  Positive for fatigue. Negative for fever.  HENT:  Negative for congestion, mouth sores and postnasal drip.   Eyes:  Positive for photophobia. Negative for visual disturbance.  Respiratory:  Negative for cough.   Cardiovascular:  Negative for chest pain.  Genitourinary:  Negative for flank pain.  Skin:  Negative for rash.  Neurological:  Positive for headaches. Negative for syncope.  Psychiatric/Behavioral:  Positive for decreased concentration, dysphoric mood and sleep disturbance.     Vital Signs: BP 129/88   Pulse 78   Temp 98 F (36.7 C)   Resp 16   Ht  (1.981 m)   Wt (!) 363 lb (164.7 kg)   SpO2 98%   BMI 41.95 kg/m    Physical Exam Vitals and nursing note reviewed.  Constitutional:      General: He is not in acute distress.    Appearance: Normal appearance. He is well-developed. He is obese. He is not diaphoretic.  HENT:     Head: Normocephalic and atraumatic.     Mouth/Throat:     Pharynx: No oropharyngeal exudate.  Eyes:     Pupils: Pupils are equal, round, and reactive to  light.  Neck:     Thyroid: No thyromegaly.     Vascular: No JVD.     Trachea: No tracheal deviation.  Cardiovascular:     Rate and Rhythm: Normal rate and regular rhythm.     Heart sounds: Normal heart sounds. No murmur heard.    No friction rub. No gallop.  Pulmonary:     Effort: Pulmonary effort is normal. No respiratory distress.     Breath sounds: No wheezing or rales.  Chest:  Chest wall: No tenderness.  Musculoskeletal:        General: Normal range of motion.     Cervical back: Normal range of motion and neck supple.  Lymphadenopathy:     Cervical: No cervical adenopathy.  Skin:    General: Skin is warm and dry.  Neurological:     Mental Status: He is alert and oriented to person, place, and time.     Cranial Nerves: No cranial nerve deficit.  Psychiatric:        Behavior: Behavior normal.        Thought Content: Thought content normal.        Judgment: Judgment normal.        Assessment/Plan: 1. Intractable migraine with aura with status migrainosus Will start back on topamax and may titrate up as needed. Will give sample and send script for nurtec to use prn - topiramate (TOPAMAX) 25 MG tablet; Take 1 tablet (25 mg total) by mouth 2 (two) times daily.  Dispense: 60 tablet; Refill: 2 - Rimegepant Sulfate (NURTEC) 75 MG TBDP; Take one tablet by mouth daily as needed for migraine  Dispense: 30 tablet; Refill: 2  2. Attention deficit hyperactivity disorder (ADHD), unspecified ADHD type Will trial Adderall XR and adjust dose as needed - amphetamine-dextroamphetamine (ADDERALL XR) 10 MG 24 hr capsule; Take 1 capsule (10 mg total) by mouth daily.  Dispense: 30 capsule; Refill: 0 [place] Controlled Substance Database was reviewed by me for overdose risk score (ORS) Refilled Controlled medications today. Reviewed risks and possible side effects associated with taking Stimulants. Combination of these drugs with other psychotropic medications could cause dizziness and  drowsiness. Pt needs to Monitor symptoms and exercise caution in driving and operating heavy machinery to avoid damages to oneself, to others and to the surroundings. Patient verbalized understanding in this matter. Dependence and abuse for these drugs will be monitored closely. A Controlled substance policy and procedure is on file which allows [facility] to order a urine drug screen test at any visit. Patient understands and agrees with the plan.   General Counseling: regie bunner understanding of the findings of todays visit and agrees with plan of treatment. I have discussed any further diagnostic evaluation that may be needed or ordered today. We also reviewed his medications today. he has been encouraged to call the office with any questions or concerns that should arise related to todays visit.    No orders of the defined types were placed in this encounter.   Meds ordered this encounter  Medications   topiramate (TOPAMAX) 25 MG tablet    Sig: Take 1 tablet (25 mg total) by mouth 2 (two) times daily.    Dispense:  60 tablet    Refill:  2   Rimegepant Sulfate (NURTEC) 75 MG TBDP    Sig: Take one tablet by mouth daily as needed for migraine    Dispense:  30 tablet    Refill:  2   amphetamine-dextroamphetamine (ADDERALL XR) 10 MG 24 hr capsule    Sig: Take 1 capsule (10 mg total) by mouth daily.    Dispense:  30 capsule    Refill:  0    This patient was seen by Lynn Ito, PA-C in collaboration with Dr. Beverely Risen as a part of collaborative care agreement.   Total time spent:30 Minutes Time spent includes review of chart, medications, test results, and follow up plan with the patient.      Dr Lyndon Code Internal medicine

## 2022-10-09 ENCOUNTER — Telehealth: Payer: Self-pay

## 2022-10-09 NOTE — Telephone Encounter (Signed)
PA was done and approved for Nurtec. Patient was notified.

## 2022-11-05 ENCOUNTER — Encounter: Payer: Self-pay | Admitting: Physician Assistant

## 2022-11-05 ENCOUNTER — Ambulatory Visit: Payer: BC Managed Care – PPO | Admitting: Physician Assistant

## 2022-11-05 VITALS — BP 125/87 | HR 97 | Temp 97.6°F | Resp 16 | Ht 78.0 in | Wt 355.4 lb

## 2022-11-05 DIAGNOSIS — F909 Attention-deficit hyperactivity disorder, unspecified type: Secondary | ICD-10-CM

## 2022-11-05 DIAGNOSIS — Z6841 Body Mass Index (BMI) 40.0 and over, adult: Secondary | ICD-10-CM

## 2022-11-05 DIAGNOSIS — G43111 Migraine with aura, intractable, with status migrainosus: Secondary | ICD-10-CM | POA: Diagnosis not present

## 2022-11-05 MED ORDER — TOPIRAMATE 25 MG PO TABS: ORAL_TABLET | ORAL | 1 refills | Status: DC

## 2022-11-05 MED ORDER — AMPHETAMINE-DEXTROAMPHET ER 20 MG PO CP24: 20.0000 mg | ORAL_CAPSULE | Freq: Every day | ORAL | 0 refills | Status: DC

## 2022-11-05 NOTE — Progress Notes (Signed)
[facility] [address] [place], [place] [address]  Internal MEDICINE  Office Visit Note  Patient Name: Ethan Gomez  [id number]  [id number]  Date of Service: 11/05/2022  Chief Complaint  Patient presents with   Migraine   ADHD    HPI Pt is here for routine follow up -Topamax helps lower intensity of migraines, but still gets them. Will increase dose to try to help lower frequency. Nurtec does help when having acute migraine and takes as needed -Adderall is helping but still having some symptoms. Still not completely able to focus on tasks. No side effects. Only time he had any heart racing is when he had a lot of caffeine with it and realized what he had done. Trying to step away from soda anyway to help weight loss. Appetite is reduced on the medication, but is aware of this and is trying lose weight without dropping too fast and makes sure he is still getting nutrition. -he states his Goal is to drop to 300lbs by end of the year. Goal to lose 30lbs over summer. Heis down 8lbs since last visit. -Does not snore, never had sleep study. May need to consider in future  Current Medication: Outpatient Encounter Medications as of 11/05/2022  Medication Sig   amphetamine-dextroamphetamine (ADDERALL XR) 20 MG 24 hr capsule Take 1 capsule (20 mg total) by mouth daily.   cetirizine (ZYRTEC) 10 MG tablet Take 10 mg by mouth at bedtime.    meloxicam (MOBIC) 15 MG tablet Take 15 mg by mouth daily.   Multiple Vitamin (MULTIVITAMIN) tablet Take 1 tablet by mouth daily.   Rimegepant Sulfate (NURTEC) 75 MG TBDP Take one tablet by mouth daily as needed for migraine   topiramate (TOPAMAX) 25 MG tablet Take 2 tabs at night and 1 tab in Am for 1 week then increase to 2 tabs twice daily.   [DISCONTINUED] amphetamine-dextroamphetamine (ADDERALL XR) 10 MG 24 hr capsule Take 1 capsule (10 mg total) by mouth daily.   [DISCONTINUED] topiramate (TOPAMAX) 25 MG tablet Take 1 tablet (25 mg total) by  mouth 2 (two) times daily.   No facility-administered encounter medications on file as of 11/05/2022.    Surgical History: Past Surgical History:  Procedure Laterality Date   BONE EXOSTOSIS EXCISION Left 01/27/2015   Procedure: EXCISION LEFT HEEL OS CALCANEAL SPUR ;  Surgeon: Loreta Ave, MD;  Location: [facility];  Service: Orthopedics;  Laterality: Left;  ANESTHESIA: GENERAL, BLOCK   left ankle surgery     TENDON REPAIR Left 01/27/2015   Procedure: REPAIR LEFT PERONEUS LONGUS TENDON;  Surgeon: Loreta Ave, MD;  Location: [facility];  Service: Orthopedics;  Laterality: Left;   VASECTOMY N/A 11/20/2019   Procedure: VASECTOMY;  Surgeon: Sondra Come, MD;  Location: [facility];  Service: Urology;  Laterality: N/A;    Medical History: Past Medical History:  Diagnosis Date   Acid reflux    OCC-TUMS PRN   Calcaneal spur of left foot 12/2014   Family history of adverse reaction to anesthesia    pt's mother has hx. of post-op N/V   Migraines    MIGRAINES   Rupture of tendon 12/2014   left ankle and foot    Family History: Family History  Problem Relation Age of Onset   Anesthesia problems Mother        post-op N/V   Arthritis Mother    Hyperlipidemia Mother    Hypertension Mother    Arthritis Other  all 4 of his grandparents   Hyperlipidemia Other        all grandparents   Hypertension Maternal Grandmother    Hypertension Paternal Grandfather    Diabetes Maternal Grandfather     Social History   Socioeconomic History   Marital status: Married    Spouse name: Not on file   Number of children: Not on file   Years of education: Not on file   Highest education level: Not on file  Occupational History   Not on file  Tobacco Use   Smoking status: Never   Smokeless tobacco: Former   Tobacco comments:    no smokeless tobacco since age 45  Vaping Use   Vaping Use: Never used  Substance and Sexual Activity   Alcohol use: Yes     Alcohol/week: 0.0 standard drinks of alcohol    Comment: rare   Drug use: No   Sexual activity: Yes  Other Topics Concern   Not on file  Social History Narrative   Not on file   Social Determinants of Health   Financial Resource Strain: Not on file  Food Insecurity: Not on file  Transportation Needs: Not on file  Physical Activity: Not on file  Stress: Not on file  Social Connections: Not on file  Intimate Partner Violence: Not on file      Review of Systems  Constitutional:  Positive for fatigue. Negative for fever.  HENT:  Negative for congestion, mouth sores and postnasal drip.   Eyes:  Negative for visual disturbance.  Respiratory:  Negative for cough.   Cardiovascular:  Negative for chest pain.  Genitourinary:  Negative for flank pain.  Skin:  Negative for rash.  Neurological:  Positive for headaches. Negative for syncope.  Psychiatric/Behavioral:  Positive for decreased concentration, dysphoric mood and sleep disturbance.     Vital Signs: BP 125/87   Pulse 97   Temp 97.6 F (36.4 C)   Resp 16   Ht 6\' 6"  (1.981 m)   Wt (!) 355 lb 6.4 oz (161.2 kg)   SpO2 98%   BMI 41.07 kg/m    Physical Exam Vitals and nursing note reviewed.  Constitutional:      General: He is not in acute distress.    Appearance: Normal appearance. He is well-developed. He is obese. He is not diaphoretic.  HENT:     Head: Normocephalic and atraumatic.     Mouth/Throat:     Pharynx: No oropharyngeal exudate.  Eyes:     Pupils: Pupils are equal, round, and reactive to light.  Neck:     Thyroid: No thyromegaly.     Vascular: No JVD.     Trachea: No tracheal deviation.  Cardiovascular:     Rate and Rhythm: Normal rate and regular rhythm.     Heart sounds: Normal heart sounds. No murmur heard.    No friction rub. No gallop.  Pulmonary:     Effort: Pulmonary effort is normal. No respiratory distress.     Breath sounds: No wheezing or rales.  Chest:     Chest wall: No tenderness.   Musculoskeletal:        General: Normal range of motion.     Cervical back: Normal range of motion and neck supple.  Lymphadenopathy:     Cervical: No cervical adenopathy.  Skin:    General: Skin is warm and dry.  Neurological:     Mental Status: He is alert and oriented to person, place, and time.  Cranial Nerves: No cranial nerve deficit.  Psychiatric:        Behavior: Behavior normal.        Thought Content: Thought content normal.        Judgment: Judgment normal.        Assessment/Plan: 1. Intractable migraine with aura with status migrainosus Will titrate up on topamax and may continue nurtec prn - topiramate (TOPAMAX) 25 MG tablet; Take 2 tabs at night and 1 tab in Am for 1 week then increase to 2 tabs twice daily.  Dispense: 90 tablet; Refill: 1  2. Attention deficit hyperactivity disorder (ADHD), unspecified ADHD type Will increase to 20mg  XR adderall and will avoid caffeine with this. Notify office if any problems with this - amphetamine-dextroamphetamine (ADDERALL XR) 20 MG 24 hr capsule; Take 1 capsule (20 mg total) by mouth daily.  Dispense: 30 capsule; Refill: 0  3. Morbid obesity with BMI of 40.0-44.9, adult (HCC) Down 8 lbs since last visit. Continue to work on diet and exercise   General Counseling: Ethan Gomez understanding of the findings of todays visit and agrees with plan of treatment. I have discussed any further diagnostic evaluation that may be needed or ordered today. We also reviewed his medications today. he has been encouraged to call the office with any questions or concerns that should arise related to todays visit.    No orders of the defined types were placed in this encounter.   Meds ordered this encounter  Medications   topiramate (TOPAMAX) 25 MG tablet    Sig: Take 2 tabs at night and 1 tab in Am for 1 week then increase to 2 tabs twice daily.    Dispense:  90 tablet    Refill:  1   amphetamine-dextroamphetamine (ADDERALL  XR) 20 MG 24 hr capsule    Sig: Take 1 capsule (20 mg total) by mouth daily.    Dispense:  30 capsule    Refill:  0    This patient was seen by Lynn Ito, PA-C in collaboration with Dr. Beverely Risen as a part of collaborative care agreement.   Total time spent:30 Minutes Time spent includes review of chart, medications, test results, and follow up plan with the patient.      Dr Lyndon Code Internal medicine

## 2022-12-03 ENCOUNTER — Ambulatory Visit (INDEPENDENT_AMBULATORY_CARE_PROVIDER_SITE_OTHER): Payer: BC Managed Care – PPO | Admitting: Physician Assistant

## 2022-12-03 ENCOUNTER — Encounter: Payer: Self-pay | Admitting: Physician Assistant

## 2022-12-03 VITALS — BP 122/80 | HR 93 | Temp 98.2°F | Resp 16 | Ht 78.0 in | Wt 353.0 lb

## 2022-12-03 DIAGNOSIS — G43111 Migraine with aura, intractable, with status migrainosus: Secondary | ICD-10-CM | POA: Diagnosis not present

## 2022-12-03 DIAGNOSIS — F909 Attention-deficit hyperactivity disorder, unspecified type: Secondary | ICD-10-CM

## 2022-12-03 MED ORDER — AMPHETAMINE-DEXTROAMPHET ER 20 MG PO CP24: 20.0000 mg | ORAL_CAPSULE | Freq: Every day | ORAL | 0 refills | Status: DC

## 2022-12-03 MED ORDER — AMPHETAMINE-DEXTROAMPHETAMINE 5 MG PO TABS: 5.0000 mg | ORAL_TABLET | Freq: Every day | ORAL | 0 refills | Status: DC

## 2022-12-03 MED ORDER — METHOCARBAMOL 750 MG PO TABS: 750.0000 mg | ORAL_TABLET | Freq: Every day | ORAL | 1 refills | Status: DC | PRN

## 2022-12-03 NOTE — Progress Notes (Signed)
[facility] [address] [place], [place] [address]  Internal MEDICINE  Office Visit Note  Patient Name: Ethan Gomez  [id number]  [id number]  Date of Service: 12/18/2022  Chief Complaint  Patient presents with   Follow-up   Gastroesophageal Reflux    HPI Pt is here for routine follow up -Increased topamax has been helping, today is first migraine in 2 weeks, taking 2tabs twice per  -Adderall XR 20mg  is helping, but wears off a little early and will add IR prn for afternoon. No side effects from adderall -Has been losing weight -had side effects with imitrex before and can't take any caffeine containing meds with his adderall -methocarbamol has helped previously when migraine continues breaking through his other meds. Nurtec works most of the time, but occasionally will not. Bernita Raisin did not help just made him tired -did remember having a sleep study as a kid and was negative OSA, was told his brain activity was elevated and did not shut off.   Current Medication: Outpatient Encounter Medications as of 12/03/2022  Medication Sig   amphetamine-dextroamphetamine (ADDERALL) 5 MG tablet Take 1 tablet (5 mg total) by mouth daily.   cetirizine (ZYRTEC) 10 MG tablet Take 10 mg by mouth at bedtime.    meloxicam (MOBIC) 15 MG tablet Take 15 mg by mouth daily.   methocarbamol (ROBAXIN) 750 MG tablet Take 1 tablet (750 mg total) by mouth daily as needed for muscle spasms.   Multiple Vitamin (MULTIVITAMIN) tablet Take 1 tablet by mouth daily.   Rimegepant Sulfate (NURTEC) 75 MG TBDP Take one tablet by mouth daily as needed for migraine   topiramate (TOPAMAX) 25 MG tablet Take 2 tabs at night and 1 tab in Am for 1 week then increase to 2 tabs twice daily.   [DISCONTINUED] amphetamine-dextroamphetamine (ADDERALL XR) 20 MG 24 hr capsule Take 1 capsule (20 mg total) by mouth daily.   amphetamine-dextroamphetamine (ADDERALL XR) 20 MG 24 hr capsule Take 1 capsule (20 mg total) by mouth  daily.   No facility-administered encounter medications on file as of 12/03/2022.    Surgical History: Past Surgical History:  Procedure Laterality Date   BONE EXOSTOSIS EXCISION Left 01/27/2015   Procedure: EXCISION LEFT HEEL OS CALCANEAL SPUR ;  Surgeon: Loreta Ave, MD;  Location: [facility];  Service: Orthopedics;  Laterality: Left;  ANESTHESIA: GENERAL, BLOCK   left ankle surgery     TENDON REPAIR Left 01/27/2015   Procedure: REPAIR LEFT PERONEUS LONGUS TENDON;  Surgeon: Loreta Ave, MD;  Location: [facility];  Service: Orthopedics;  Laterality: Left;   VASECTOMY N/A 11/20/2019   Procedure: VASECTOMY;  Surgeon: Sondra Come, MD;  Location: [facility];  Service: Urology;  Laterality: N/A;    Medical History: Past Medical History:  Diagnosis Date   Acid reflux    OCC-TUMS PRN   Calcaneal spur of left foot 12/2014   Family history of adverse reaction to anesthesia    pt's mother has hx. of post-op N/V   Migraines    MIGRAINES   Rupture of tendon 12/2014   left ankle and foot    Family History: Family History  Problem Relation Age of Onset   Anesthesia problems Mother        post-op N/V   Arthritis Mother    Hyperlipidemia Mother    Hypertension Mother    Arthritis Other        all 4 of his grandparents   Hyperlipidemia Other  all grandparents   Hypertension Maternal Grandmother    Hypertension Paternal Grandfather    Diabetes Maternal Grandfather     Social History   Socioeconomic History   Marital status: Married    Spouse name: Not on file   Number of children: Not on file   Years of education: Not on file   Highest education level: Not on file  Occupational History   Not on file  Tobacco Use   Smoking status: Never   Smokeless tobacco: Former   Tobacco comments:    no smokeless tobacco since age 44  Vaping Use   Vaping Use: Never used  Substance and Sexual Activity   Alcohol use: Yes    Alcohol/week: 0.0  standard drinks of alcohol    Comment: rare   Drug use: No   Sexual activity: Yes  Other Topics Concern   Not on file  Social History Narrative   Not on file   Social Determinants of Health   Financial Resource Strain: Not on file  Food Insecurity: Not on file  Transportation Needs: Not on file  Physical Activity: Not on file  Stress: Not on file  Social Connections: Not on file  Intimate Partner Violence: Not on file      Review of Systems  Constitutional:  Positive for fatigue. Negative for fever.  HENT:  Negative for congestion, mouth sores and postnasal drip.   Eyes:  Negative for visual disturbance.  Respiratory:  Negative for cough.   Cardiovascular:  Negative for chest pain.  Genitourinary:  Negative for flank pain.  Skin:  Negative for rash.  Neurological:  Positive for headaches. Negative for syncope.  Psychiatric/Behavioral:  Positive for decreased concentration, dysphoric mood and sleep disturbance.     Vital Signs: BP 122/80   Pulse 93   Temp 98.2 F (36.8 C)   Resp 16   Ht 6\' 6"  (1.981 m)   Wt (!) 353 lb (160.1 kg)   SpO2 98%   BMI 40.79 kg/m    Physical Exam Vitals and nursing note reviewed.  Constitutional:      General: He is not in acute distress.    Appearance: Normal appearance. He is well-developed. He is obese. He is not diaphoretic.  HENT:     Head: Normocephalic and atraumatic.     Mouth/Throat:     Pharynx: No oropharyngeal exudate.  Eyes:     Pupils: Pupils are equal, round, and reactive to light.  Neck:     Thyroid: No thyromegaly.     Vascular: No JVD.     Trachea: No tracheal deviation.  Cardiovascular:     Rate and Rhythm: Normal rate and regular rhythm.     Heart sounds: Normal heart sounds. No murmur heard.    No friction rub. No gallop.  Pulmonary:     Effort: Pulmonary effort is normal. No respiratory distress.     Breath sounds: No wheezing or rales.  Chest:     Chest wall: No tenderness.  Musculoskeletal:         General: Normal range of motion.     Cervical back: Normal range of motion and neck supple.  Lymphadenopathy:     Cervical: No cervical adenopathy.  Skin:    General: Skin is warm and dry.  Neurological:     Mental Status: He is alert and oriented to person, place, and time.     Cranial Nerves: No cranial nerve deficit.  Psychiatric:        Behavior:  Behavior normal.        Thought Content: Thought content normal.        Judgment: Judgment normal.        Assessment/Plan: 1. Attention deficit hyperactivity disorder (ADHD), unspecified ADHD type Will continue adderall XR and may use 5mg  prn in afternoon - amphetamine-dextroamphetamine (ADDERALL) 5 MG tablet; Take 1 tablet (5 mg total) by mouth daily.  Dispense: 30 tablet; Refill: 0 - amphetamine-dextroamphetamine (ADDERALL XR) 20 MG 24 hr capsule; Take 1 capsule (20 mg total) by mouth daily.  Dispense: 30 capsule; Refill: 0  2. Intractable migraine with aura with status migrainosus May use methocarbamol as needed for breakthrough migraine symptoms. May continue to use nurtec prn and topamax 50mg  BID - methocarbamol (ROBAXIN) 750 MG tablet; Take 1 tablet (750 mg total) by mouth daily as needed for muscle spasms.  Dispense: 30 tablet; Refill: 1   General Counseling: Kimon verbalizes understanding of the findings of todays visit and agrees with plan of treatment. I have discussed any further diagnostic evaluation that may be needed or ordered today. We also reviewed his medications today. he has been encouraged to call the office with any questions or concerns that should arise related to todays visit.    No orders of the defined types were placed in this encounter.   Meds ordered this encounter  Medications   amphetamine-dextroamphetamine (ADDERALL) 5 MG tablet    Sig: Take 1 tablet (5 mg total) by mouth daily.    Dispense:  30 tablet    Refill:  0   amphetamine-dextroamphetamine (ADDERALL XR) 20 MG 24 hr capsule    Sig:  Take 1 capsule (20 mg total) by mouth daily.    Dispense:  30 capsule    Refill:  0   methocarbamol (ROBAXIN) 750 MG tablet    Sig: Take 1 tablet (750 mg total) by mouth daily as needed for muscle spasms.    Dispense:  30 tablet    Refill:  1    This patient was seen by Lynn Ito, PA-C in collaboration with Dr. Beverely Risen as a part of collaborative care agreement.   Total time spent:30 Minutes Time spent includes review of chart, medications, test results, and follow up plan with the patient.      Dr Lyndon Code Internal medicine

## 2022-12-14 DIAGNOSIS — M62830 Muscle spasm of back: Secondary | ICD-10-CM | POA: Diagnosis not present

## 2022-12-14 DIAGNOSIS — M9903 Segmental and somatic dysfunction of lumbar region: Secondary | ICD-10-CM | POA: Diagnosis not present

## 2022-12-14 DIAGNOSIS — M9901 Segmental and somatic dysfunction of cervical region: Secondary | ICD-10-CM | POA: Diagnosis not present

## 2022-12-14 DIAGNOSIS — R519 Headache, unspecified: Secondary | ICD-10-CM | POA: Diagnosis not present

## 2022-12-17 DIAGNOSIS — R519 Headache, unspecified: Secondary | ICD-10-CM | POA: Diagnosis not present

## 2022-12-17 DIAGNOSIS — M9903 Segmental and somatic dysfunction of lumbar region: Secondary | ICD-10-CM | POA: Diagnosis not present

## 2022-12-17 DIAGNOSIS — M9901 Segmental and somatic dysfunction of cervical region: Secondary | ICD-10-CM | POA: Diagnosis not present

## 2022-12-17 DIAGNOSIS — M62830 Muscle spasm of back: Secondary | ICD-10-CM | POA: Diagnosis not present

## 2022-12-18 DIAGNOSIS — M9901 Segmental and somatic dysfunction of cervical region: Secondary | ICD-10-CM | POA: Diagnosis not present

## 2022-12-18 DIAGNOSIS — M9903 Segmental and somatic dysfunction of lumbar region: Secondary | ICD-10-CM | POA: Diagnosis not present

## 2022-12-18 DIAGNOSIS — M62830 Muscle spasm of back: Secondary | ICD-10-CM | POA: Diagnosis not present

## 2022-12-18 DIAGNOSIS — R519 Headache, unspecified: Secondary | ICD-10-CM | POA: Diagnosis not present

## 2022-12-19 DIAGNOSIS — M9901 Segmental and somatic dysfunction of cervical region: Secondary | ICD-10-CM | POA: Diagnosis not present

## 2022-12-19 DIAGNOSIS — R519 Headache, unspecified: Secondary | ICD-10-CM | POA: Diagnosis not present

## 2022-12-19 DIAGNOSIS — M9903 Segmental and somatic dysfunction of lumbar region: Secondary | ICD-10-CM | POA: Diagnosis not present

## 2022-12-19 DIAGNOSIS — M62830 Muscle spasm of back: Secondary | ICD-10-CM | POA: Diagnosis not present

## 2022-12-24 DIAGNOSIS — M62830 Muscle spasm of back: Secondary | ICD-10-CM | POA: Diagnosis not present

## 2022-12-24 DIAGNOSIS — M9903 Segmental and somatic dysfunction of lumbar region: Secondary | ICD-10-CM | POA: Diagnosis not present

## 2022-12-24 DIAGNOSIS — M9901 Segmental and somatic dysfunction of cervical region: Secondary | ICD-10-CM | POA: Diagnosis not present

## 2022-12-24 DIAGNOSIS — R519 Headache, unspecified: Secondary | ICD-10-CM | POA: Diagnosis not present

## 2022-12-31 DIAGNOSIS — M62830 Muscle spasm of back: Secondary | ICD-10-CM | POA: Diagnosis not present

## 2022-12-31 DIAGNOSIS — R519 Headache, unspecified: Secondary | ICD-10-CM | POA: Diagnosis not present

## 2022-12-31 DIAGNOSIS — M9903 Segmental and somatic dysfunction of lumbar region: Secondary | ICD-10-CM | POA: Diagnosis not present

## 2022-12-31 DIAGNOSIS — M9901 Segmental and somatic dysfunction of cervical region: Secondary | ICD-10-CM | POA: Diagnosis not present

## 2023-01-02 ENCOUNTER — Telehealth: Payer: Self-pay

## 2023-01-02 ENCOUNTER — Other Ambulatory Visit: Payer: Self-pay | Admitting: Physician Assistant

## 2023-01-02 DIAGNOSIS — F909 Attention-deficit hyperactivity disorder, unspecified type: Secondary | ICD-10-CM

## 2023-01-02 MED ORDER — AMPHETAMINE-DEXTROAMPHET ER 20 MG PO CP24: 20.0000 mg | ORAL_CAPSULE | Freq: Every day | ORAL | 0 refills | Status: DC

## 2023-01-02 MED ORDER — AMPHETAMINE-DEXTROAMPHETAMINE 5 MG PO TABS: 5.0000 mg | ORAL_TABLET | Freq: Every day | ORAL | 0 refills | Status: DC

## 2023-01-03 NOTE — Telephone Encounter (Signed)
Patient notified

## 2023-01-29 ENCOUNTER — Other Ambulatory Visit: Payer: Self-pay | Admitting: Physician Assistant

## 2023-01-29 DIAGNOSIS — G43111 Migraine with aura, intractable, with status migrainosus: Secondary | ICD-10-CM

## 2023-02-06 ENCOUNTER — Other Ambulatory Visit: Payer: Self-pay | Admitting: Physician Assistant

## 2023-02-06 ENCOUNTER — Telehealth: Payer: Self-pay

## 2023-02-06 DIAGNOSIS — F909 Attention-deficit hyperactivity disorder, unspecified type: Secondary | ICD-10-CM

## 2023-02-06 MED ORDER — AMPHETAMINE-DEXTROAMPHET ER 20 MG PO CP24: 20.0000 mg | ORAL_CAPSULE | Freq: Every day | ORAL | 0 refills | Status: DC

## 2023-02-06 MED ORDER — AMPHETAMINE-DEXTROAMPHETAMINE 5 MG PO TABS: 5.0000 mg | ORAL_TABLET | Freq: Every day | ORAL | 0 refills | Status: DC

## 2023-02-06 NOTE — Telephone Encounter (Signed)
Lmom  that we send med keep follow up appt for next refills

## 2023-03-07 ENCOUNTER — Ambulatory Visit (INDEPENDENT_AMBULATORY_CARE_PROVIDER_SITE_OTHER): Payer: BC Managed Care – PPO | Admitting: Physician Assistant

## 2023-03-07 ENCOUNTER — Encounter: Payer: Self-pay | Admitting: Physician Assistant

## 2023-03-07 VITALS — BP 140/90 | HR 91 | Temp 97.8°F | Resp 16 | Ht 78.0 in | Wt 331.6 lb

## 2023-03-07 DIAGNOSIS — R030 Elevated blood-pressure reading, without diagnosis of hypertension: Secondary | ICD-10-CM

## 2023-03-07 DIAGNOSIS — F909 Attention-deficit hyperactivity disorder, unspecified type: Secondary | ICD-10-CM

## 2023-03-07 DIAGNOSIS — E669 Obesity, unspecified: Secondary | ICD-10-CM

## 2023-03-07 DIAGNOSIS — F411 Generalized anxiety disorder: Secondary | ICD-10-CM | POA: Diagnosis not present

## 2023-03-07 DIAGNOSIS — G43111 Migraine with aura, intractable, with status migrainosus: Secondary | ICD-10-CM | POA: Diagnosis not present

## 2023-03-07 MED ORDER — AMPHETAMINE-DEXTROAMPHETAMINE 5 MG PO TABS: 5.0000 mg | ORAL_TABLET | Freq: Every day | ORAL | 0 refills | Status: DC

## 2023-03-07 MED ORDER — AMPHETAMINE-DEXTROAMPHET ER 20 MG PO CP24: 20.0000 mg | ORAL_CAPSULE | Freq: Every day | ORAL | 0 refills | Status: DC

## 2023-03-07 MED ORDER — BUPROPION HCL ER (XL) 150 MG PO TB24: 150.0000 mg | ORAL_TABLET | Freq: Every day | ORAL | 2 refills | Status: DC

## 2023-03-07 MED ORDER — TOPIRAMATE 50 MG PO TABS: 50.0000 mg | ORAL_TABLET | Freq: Two times a day (BID) | ORAL | 1 refills | Status: DC

## 2023-03-07 NOTE — Progress Notes (Signed)
[facility] [address] [place], [place] [address]  Internal MEDICINE  Office Visit Note  Patient Name: Ethan Gomez  [id number]  [id number]  Date of Service: 03/07/2023  Chief Complaint  Patient presents with   Follow-up   Gastroesophageal Reflux    HPI Pt is here for routine follow up for med refills -down 22lbs since last visit, eating less and doing some at-home workouts and this along with adderall has been successful in his weight loss -his personal goal wt 260lbs -Doing well with topamax and nurtec -migraines decreased to 3 per month but each one can linger a few days, not as strong -Taking 20mg  XR and 5mg  IR adderall in afternoon, no impact on sleep as long as he takes 5mg  before 2pm and is working well to help keep him focused -loss of interest in sex had gotten better when out of work in summer, but now that he is back working this has been problematic again. Attributes this to stress from work -would like to try medication to help this. Discussed options of effexor which could also help migraines vs wellbutrin which could also help focus and continue to help weight loss without adding as much further sexual side effects. He prefers to start with wellbutrin -Did kick one of his weights a few weeks ago and is bruised/painful along right outer edge of foot. Possible hairline fracture but denies anything out of place. States he can afford to see specialist right now and declines me ordering an xray at this time because he would not be able to wear a boot anyways if needed. He is wrapping in ACE bandage and using NSAID and ice and will keep monitoring. Will go to ortho if any worsening/not improving. May be impacting BP in office today and he will start monitoring at home  Current Medication: Outpatient Encounter Medications as of 03/07/2023  Medication Sig   buPROPion (WELLBUTRIN XL) 150 MG 24 hr tablet Take 1 tablet (150 mg total) by mouth daily.   cetirizine  (ZYRTEC) 10 MG tablet Take 10 mg by mouth at bedtime.    meloxicam (MOBIC) 15 MG tablet Take 15 mg by mouth daily.   methocarbamol (ROBAXIN) 750 MG tablet TAKE 1 TABLET (750 MG TOTAL) BY MOUTH DAILY AS NEEDED FOR MUSCLE SPASMS   Multiple Vitamin (MULTIVITAMIN) tablet Take 1 tablet by mouth daily.   Rimegepant Sulfate (NURTEC) 75 MG TBDP Take one tablet by mouth daily as needed for migraine   topiramate (TOPAMAX) 50 MG tablet Take 1 tablet (50 mg total) by mouth 2 (two) times daily.   [DISCONTINUED] amphetamine-dextroamphetamine (ADDERALL XR) 20 MG 24 hr capsule Take 1 capsule (20 mg total) by mouth daily.   [DISCONTINUED] amphetamine-dextroamphetamine (ADDERALL) 5 MG tablet Take 1 tablet (5 mg total) by mouth daily.   [DISCONTINUED] topiramate (TOPAMAX) 25 MG tablet TAKE 2 TABS AT NIGHT AND 1 TAB IN AM FOR 1 WEEK THEN INCREASE TO 2 TABS TWICE DAILY.   amphetamine-dextroamphetamine (ADDERALL XR) 20 MG 24 hr capsule Take 1 capsule (20 mg total) by mouth daily.   amphetamine-dextroamphetamine (ADDERALL) 5 MG tablet Take 1 tablet (5 mg total) by mouth daily.   No facility-administered encounter medications on file as of 03/07/2023.    Surgical History: Past Surgical History:  Procedure Laterality Date   BONE EXOSTOSIS EXCISION Left 01/27/2015   Procedure: EXCISION LEFT HEEL OS CALCANEAL SPUR ;  Surgeon: Loreta Ave, MD;  Location: [facility];  Service: Orthopedics;  Laterality:  Left;  ANESTHESIA: GENERAL, BLOCK   left ankle surgery     TENDON REPAIR Left 01/27/2015   Procedure: REPAIR LEFT PERONEUS LONGUS TENDON;  Surgeon: Loreta Ave, MD;  Location: [facility];  Service: Orthopedics;  Laterality: Left;   VASECTOMY N/A 11/20/2019   Procedure: VASECTOMY;  Surgeon: Sondra Come, MD;  Location: [facility];  Service: Urology;  Laterality: N/A;    Medical History: Past Medical History:  Diagnosis Date   Acid reflux    OCC-TUMS PRN   Calcaneal spur of left foot  12/2014   Family history of adverse reaction to anesthesia    pt's mother has hx. of post-op N/V   Migraines    MIGRAINES   Rupture of tendon 12/2014   left ankle and foot    Family History: Family History  Problem Relation Age of Onset   Anesthesia problems Mother        post-op N/V   Arthritis Mother    Hyperlipidemia Mother    Hypertension Mother    Arthritis Other        all 4 of his grandparents   Hyperlipidemia Other        all grandparents   Hypertension Maternal Grandmother    Hypertension Paternal Grandfather    Diabetes Maternal Grandfather     Social History   Socioeconomic History   Marital status: Married    Spouse name: Not on file   Number of children: Not on file   Years of education: Not on file   Highest education level: Not on file  Occupational History   Not on file  Tobacco Use   Smoking status: Never   Smokeless tobacco: Former   Tobacco comments:    no smokeless tobacco since age 89  Vaping Use   Vaping status: Never Used  Substance and Sexual Activity   Alcohol use: Yes    Alcohol/week: 0.0 standard drinks of alcohol    Comment: rare   Drug use: No   Sexual activity: Yes  Other Topics Concern   Not on file  Social History Narrative   Not on file   Social Determinants of Health   Financial Resource Strain: Not on file  Food Insecurity: Not on file  Transportation Needs: Not on file  Physical Activity: Not on file  Stress: Not on file  Social Connections: Not on file  Intimate Partner Violence: Not on file      Review of Systems  Constitutional:  Positive for fatigue. Negative for fever.  HENT:  Negative for congestion, mouth sores and postnasal drip.   Eyes:  Negative for visual disturbance.  Respiratory:  Negative for cough.   Cardiovascular:  Negative for chest pain.  Genitourinary:  Negative for flank pain.  Musculoskeletal:  Positive for arthralgias and gait problem.  Skin:  Negative for rash.  Neurological:   Positive for headaches. Negative for syncope.  Psychiatric/Behavioral:  Positive for dysphoric mood. The patient is nervous/anxious.     Vital Signs: BP (!) 140/90   Pulse 91   Temp 97.8 F (36.6 C)   Resp 16   Ht 6\' 6"  (1.981 m)   Wt (!) 331 lb 9.6 oz (150.4 kg)   SpO2 99%   BMI 38.32 kg/m    Physical Exam Vitals and nursing note reviewed.  Constitutional:      General: He is not in acute distress.    Appearance: Normal appearance. He is well-developed. He is obese. He is not diaphoretic.  HENT:     Head: Normocephalic and atraumatic.     Mouth/Throat:     Pharynx: No oropharyngeal exudate.  Eyes:     Pupils: Pupils are equal, round, and reactive to light.  Neck:     Thyroid: No thyromegaly.     Vascular: No JVD.     Trachea: No tracheal deviation.  Cardiovascular:     Rate and Rhythm: Normal rate and regular rhythm.     Heart sounds: Normal heart sounds. No murmur heard.    No friction rub. No gallop.  Pulmonary:     Effort: Pulmonary effort is normal. No respiratory distress.     Breath sounds: No wheezing or rales.  Chest:     Chest wall: No tenderness.  Musculoskeletal:        General: Normal range of motion.     Cervical back: Normal range of motion and neck supple.  Lymphadenopathy:     Cervical: No cervical adenopathy.  Skin:    General: Skin is warm and dry.  Neurological:     Mental Status: He is alert and oriented to person, place, and time.     Cranial Nerves: No cranial nerve deficit.  Psychiatric:        Behavior: Behavior normal.        Thought Content: Thought content normal.        Judgment: Judgment normal.        Assessment/Plan: 1. GAD (generalized anxiety disorder) Will start on wellbutrin to help with daily stress at work - buPROPion (WELLBUTRIN XL) 150 MG 24 hr tablet; Take 1 tablet (150 mg total) by mouth daily.  Dispense: 30 tablet; Refill: 2  2. Attention deficit hyperactivity disorder (ADHD), unspecified ADHD type May  continue adderall as before - amphetamine-dextroamphetamine (ADDERALL) 5 MG tablet; Take 1 tablet (5 mg total) by mouth daily.  Dispense: 30 tablet; Refill: 0 - amphetamine-dextroamphetamine (ADDERALL XR) 20 MG 24 hr capsule; Take 1 capsule (20 mg total) by mouth daily.  Dispense: 30 capsule; Refill: 0 [place] Controlled Substance Database was reviewed by me for overdose risk score (ORS) Refilled Controlled medications today. Reviewed risks and possible side effects associated with taking Stimulants. Combination of these drugs with other psychotropic medications could cause dizziness and drowsiness. Pt needs to Monitor symptoms and exercise caution in driving and operating heavy machinery to avoid damages to oneself, to others and to the surroundings. Patient verbalized understanding in this matter. Dependence and abuse for these drugs will be monitored closely. A Controlled substance policy and procedure is on file which allows [facility] to order a urine drug screen test at any visit. Patient understands and agrees with the plan..  3. Intractable migraine with aura with status migrainosus Continue topamax as before - topiramate (TOPAMAX) 50 MG tablet; Take 1 tablet (50 mg total) by mouth 2 (two) times daily.  Dispense: 180 tablet; Refill: 1  4. Elevated BP without diagnosis of hypertension Likely elevated due to work stress and foot pain, will start monitoring  5. Obesity (BMI 30-39.9) Down 22lbs since last visit and will continue to work on diet and exercise    General Counseling: bosten fagot understanding of the findings of todays visit and agrees with plan of treatment. I have discussed any further diagnostic evaluation that may be needed or ordered today. We also reviewed his medications today. he has been encouraged to call the office with any questions or concerns that should arise related to todays visit.    No  orders of the defined types were placed in this  encounter.   Meds ordered this encounter  Medications   buPROPion (WELLBUTRIN XL) 150 MG 24 hr tablet    Sig: Take 1 tablet (150 mg total) by mouth daily.    Dispense:  30 tablet    Refill:  2   topiramate (TOPAMAX) 50 MG tablet    Sig: Take 1 tablet (50 mg total) by mouth 2 (two) times daily.    Dispense:  180 tablet    Refill:  1   amphetamine-dextroamphetamine (ADDERALL) 5 MG tablet    Sig: Take 1 tablet (5 mg total) by mouth daily.    Dispense:  30 tablet    Refill:  0   amphetamine-dextroamphetamine (ADDERALL XR) 20 MG 24 hr capsule    Sig: Take 1 capsule (20 mg total) by mouth daily.    Dispense:  30 capsule    Refill:  0    This patient was seen by Lynn Ito, PA-C in collaboration with Dr. Beverely Risen as a part of collaborative care agreement.   Total time spent:30 Minutes Time spent includes review of chart, medications, test results, and follow up plan with the patient.      Dr Lyndon Code Internal medicine

## 2023-03-19 DIAGNOSIS — M79671 Pain in right foot: Secondary | ICD-10-CM | POA: Diagnosis not present

## 2023-03-23 ENCOUNTER — Other Ambulatory Visit: Payer: Self-pay | Admitting: Physician Assistant

## 2023-03-23 DIAGNOSIS — G43111 Migraine with aura, intractable, with status migrainosus: Secondary | ICD-10-CM

## 2023-03-30 ENCOUNTER — Other Ambulatory Visit: Payer: Self-pay | Admitting: Physician Assistant

## 2023-03-30 DIAGNOSIS — F411 Generalized anxiety disorder: Secondary | ICD-10-CM

## 2023-04-02 DIAGNOSIS — M79671 Pain in right foot: Secondary | ICD-10-CM | POA: Diagnosis not present

## 2023-04-08 ENCOUNTER — Other Ambulatory Visit: Payer: Self-pay | Admitting: Physician Assistant

## 2023-04-08 ENCOUNTER — Telehealth: Payer: Self-pay

## 2023-04-08 DIAGNOSIS — F909 Attention-deficit hyperactivity disorder, unspecified type: Secondary | ICD-10-CM

## 2023-04-08 MED ORDER — AMPHETAMINE-DEXTROAMPHETAMINE 5 MG PO TABS: 5.0000 mg | ORAL_TABLET | Freq: Every day | ORAL | 0 refills | Status: DC

## 2023-04-08 MED ORDER — AMPHETAMINE-DEXTROAMPHET ER 20 MG PO CP24: 20.0000 mg | ORAL_CAPSULE | Freq: Every day | ORAL | 0 refills | Status: DC

## 2023-04-08 NOTE — Telephone Encounter (Signed)
Left message letting him know his medicine were send in.

## 2023-04-18 ENCOUNTER — Encounter: Payer: Self-pay | Admitting: Physician Assistant

## 2023-04-18 ENCOUNTER — Ambulatory Visit: Payer: BC Managed Care – PPO | Admitting: Physician Assistant

## 2023-04-18 VITALS — BP 130/88 | HR 90 | Temp 98.4°F | Resp 16 | Ht 78.0 in | Wt 320.2 lb

## 2023-04-18 DIAGNOSIS — J0100 Acute maxillary sinusitis, unspecified: Secondary | ICD-10-CM

## 2023-04-18 DIAGNOSIS — F909 Attention-deficit hyperactivity disorder, unspecified type: Secondary | ICD-10-CM

## 2023-04-18 DIAGNOSIS — E669 Obesity, unspecified: Secondary | ICD-10-CM

## 2023-04-18 DIAGNOSIS — F411 Generalized anxiety disorder: Secondary | ICD-10-CM | POA: Diagnosis not present

## 2023-04-18 MED ORDER — BUPROPION HCL ER (XL) 300 MG PO TB24: 300.0000 mg | ORAL_TABLET | Freq: Every day | ORAL | 2 refills | Status: DC

## 2023-04-18 MED ORDER — AMOXICILLIN-POT CLAVULANATE 875-125 MG PO TABS: 1.0000 | ORAL_TABLET | Freq: Two times a day (BID) | ORAL | 0 refills | Status: DC

## 2023-04-18 NOTE — Progress Notes (Signed)
[facility] [address] [place], [place] [address]  Internal MEDICINE  Office Visit Note  Patient Name: Ethan Gomez  [id number]  [id number]  Date of Service: 04/18/2023  Chief Complaint  Patient presents with   Follow-up   Gastroesophageal Reflux   Hypertension    HPI Pt is here for routine follow up -was started on wellbutrin last month, not sure if making any difference. Would like to increase dose -down 11 lbs since last visit, has been really working on weight loss goals. Goal is to get to 260lbs. Wants to be healthier -cold symptoms started Sunday, some coughing, intermittent fever, hoarse voice, sinus congestion/pressure. Taking robitussin with decongestant which may be driving BP up some.  Some ear pressure too -Foot doing better now, was in a boot for 2 weeks after seeing ortho. -doing well with adderall dosing, no S/E from this -migraines well controlled, maybe 2 per month now  Current Medication: Outpatient Encounter Medications as of 04/18/2023  Medication Sig   amoxicillin-clavulanate (AUGMENTIN) 875-125 MG tablet Take 1 tablet by mouth 2 (two) times daily. Take with food.   amphetamine-dextroamphetamine (ADDERALL XR) 20 MG 24 hr capsule Take 1 capsule (20 mg total) by mouth daily.   amphetamine-dextroamphetamine (ADDERALL) 5 MG tablet Take 1 tablet (5 mg total) by mouth daily.   buPROPion (WELLBUTRIN XL) 300 MG 24 hr tablet Take 1 tablet (300 mg total) by mouth daily.   cetirizine (ZYRTEC) 10 MG tablet Take 10 mg by mouth at bedtime.    meloxicam (MOBIC) 15 MG tablet Take 15 mg by mouth daily.   methocarbamol (ROBAXIN) 750 MG tablet TAKE 1 TABLET (750 MG TOTAL) BY MOUTH DAILY AS NEEDED FOR MUSCLE SPASMS   Multiple Vitamin (MULTIVITAMIN) tablet Take 1 tablet by mouth daily.   Rimegepant Sulfate (NURTEC) 75 MG TBDP Take one tablet by mouth daily as needed for migraine   topiramate (TOPAMAX) 50 MG tablet Take 1 tablet (50 mg total) by mouth 2 (two) times  daily.   [DISCONTINUED] buPROPion (WELLBUTRIN XL) 150 MG 24 hr tablet Take 1 tablet (150 mg total) by mouth daily.   No facility-administered encounter medications on file as of 04/18/2023.    Surgical History: Past Surgical History:  Procedure Laterality Date   BONE EXOSTOSIS EXCISION Left 01/27/2015   Procedure: EXCISION LEFT HEEL OS CALCANEAL SPUR ;  Surgeon: Loreta Ave, MD;  Location: [facility];  Service: Orthopedics;  Laterality: Left;  ANESTHESIA: GENERAL, BLOCK   left ankle surgery     TENDON REPAIR Left 01/27/2015   Procedure: REPAIR LEFT PERONEUS LONGUS TENDON;  Surgeon: Loreta Ave, MD;  Location: [facility];  Service: Orthopedics;  Laterality: Left;   VASECTOMY N/A 11/20/2019   Procedure: VASECTOMY;  Surgeon: Sondra Come, MD;  Location: [facility];  Service: Urology;  Laterality: N/A;    Medical History: Past Medical History:  Diagnosis Date   Acid reflux    OCC-TUMS PRN   Calcaneal spur of left foot 12/2014   Family history of adverse reaction to anesthesia    pt's mother has hx. of post-op N/V   Migraines    MIGRAINES   Rupture of tendon 12/2014   left ankle and foot    Family History: Family History  Problem Relation Age of Onset   Anesthesia problems Mother        post-op N/V   Arthritis Mother    Hyperlipidemia Mother    Hypertension Mother    Arthritis  Other        all 4 of his grandparents   Hyperlipidemia Other        all grandparents   Hypertension Maternal Grandmother    Hypertension Paternal Grandfather    Diabetes Maternal Grandfather     Social History   Socioeconomic History   Marital status: Married    Spouse name: Not on file   Number of children: Not on file   Years of education: Not on file   Highest education level: Not on file  Occupational History   Not on file  Tobacco Use   Smoking status: Never   Smokeless tobacco: Former   Tobacco comments:    no smokeless tobacco since age 57   Vaping Use   Vaping status: Never Used  Substance and Sexual Activity   Alcohol use: Yes    Alcohol/week: 0.0 standard drinks of alcohol    Comment: rare   Drug use: No   Sexual activity: Yes  Other Topics Concern   Not on file  Social History Narrative   Not on file   Social Determinants of Health   Financial Resource Strain: Not on file  Food Insecurity: Not on file  Transportation Needs: Not on file  Physical Activity: Not on file  Stress: Not on file  Social Connections: Not on file  Intimate Partner Violence: Not on file      Review of Systems  Constitutional:  Positive for fatigue. Negative for fever.  HENT:  Positive for congestion, ear pain, postnasal drip, sinus pressure and sore throat. Negative for mouth sores.   Eyes:  Negative for visual disturbance.  Respiratory:  Positive for cough.   Cardiovascular:  Negative for chest pain.  Genitourinary:  Negative for flank pain.  Musculoskeletal:  Negative for arthralgias.  Skin:  Negative for rash.  Neurological:  Negative for syncope and headaches.  Psychiatric/Behavioral:  Positive for dysphoric mood. The patient is nervous/anxious.     Vital Signs: BP 130/88 Comment: 143/90  Pulse 90 Comment: 108  Temp 98.4 F (36.9 C)   Resp 16   Ht 6\' 6"  (1.981 m)   Wt (!) 320 lb 3.2 oz (145.2 kg)   SpO2 98%   BMI 37.00 kg/m    Physical Exam Vitals and nursing note reviewed.  Constitutional:      General: He is not in acute distress.    Appearance: Normal appearance. He is well-developed. He is obese. He is not diaphoretic.  HENT:     Head: Normocephalic and atraumatic.     Mouth/Throat:     Pharynx: No oropharyngeal exudate.  Eyes:     Pupils: Pupils are equal, round, and reactive to light.  Neck:     Thyroid: No thyromegaly.     Vascular: No JVD.     Trachea: No tracheal deviation.  Cardiovascular:     Rate and Rhythm: Normal rate and regular rhythm.     Heart sounds: Normal heart sounds. No murmur  heard.    No friction rub. No gallop.  Pulmonary:     Effort: Pulmonary effort is normal. No respiratory distress.     Breath sounds: No wheezing or rales.  Chest:     Chest wall: No tenderness.  Musculoskeletal:        General: Normal range of motion.     Cervical back: Normal range of motion and neck supple.  Lymphadenopathy:     Cervical: No cervical adenopathy.  Skin:    General: Skin is warm  and dry.  Neurological:     Mental Status: He is alert and oriented to person, place, and time.     Cranial Nerves: No cranial nerve deficit.  Psychiatric:        Behavior: Behavior normal.        Thought Content: Thought content normal.        Judgment: Judgment normal.        Assessment/Plan: 1. GAD (generalized anxiety disorder) Will increase wellbutrin and recheck in 4 weeks - buPROPion (WELLBUTRIN XL) 300 MG 24 hr tablet; Take 1 tablet (300 mg total) by mouth daily.  Dispense: 30 tablet; Refill: 2  2. Acute non-recurrent maxillary sinusitis May start on augmentin if not improving in a few days. May continue cough medication OTC - amoxicillin-clavulanate (AUGMENTIN) 875-125 MG tablet; Take 1 tablet by mouth 2 (two) times daily. Take with food.  Dispense: 20 tablet; Refill: 0  3. Attention deficit hyperactivity disorder (ADHD), unspecified ADHD type May continue adderall as before  4. Obesity (BMI 30-39.9) Down another 11lbs, will continue to work on diet and exercise   General Counseling: arlan obier understanding of the findings of todays visit and agrees with plan of treatment. I have discussed any further diagnostic evaluation that may be needed or ordered today. We also reviewed his medications today. he has been encouraged to call the office with any questions or concerns that should arise related to todays visit.    No orders of the defined types were placed in this encounter.   Meds ordered this encounter  Medications   buPROPion (WELLBUTRIN XL) 300 MG 24  hr tablet    Sig: Take 1 tablet (300 mg total) by mouth daily.    Dispense:  30 tablet    Refill:  2   amoxicillin-clavulanate (AUGMENTIN) 875-125 MG tablet    Sig: Take 1 tablet by mouth 2 (two) times daily. Take with food.    Dispense:  20 tablet    Refill:  0    This patient was seen by Lynn Ito, PA-C in collaboration with Dr. Beverely Risen as a part of collaborative care agreement.   Total time spent:30 Minutes Time spent includes review of chart, medications, test results, and follow up plan with the patient.      Dr Lyndon Code Internal medicine

## 2023-05-08 DIAGNOSIS — M9901 Segmental and somatic dysfunction of cervical region: Secondary | ICD-10-CM | POA: Diagnosis not present

## 2023-05-08 DIAGNOSIS — M9903 Segmental and somatic dysfunction of lumbar region: Secondary | ICD-10-CM | POA: Diagnosis not present

## 2023-05-08 DIAGNOSIS — M62830 Muscle spasm of back: Secondary | ICD-10-CM | POA: Diagnosis not present

## 2023-05-08 DIAGNOSIS — R519 Headache, unspecified: Secondary | ICD-10-CM | POA: Diagnosis not present

## 2023-05-10 ENCOUNTER — Other Ambulatory Visit: Payer: Self-pay | Admitting: Physician Assistant

## 2023-05-10 DIAGNOSIS — F909 Attention-deficit hyperactivity disorder, unspecified type: Secondary | ICD-10-CM

## 2023-05-10 MED ORDER — AMPHETAMINE-DEXTROAMPHET ER 20 MG PO CP24: 20.0000 mg | ORAL_CAPSULE | Freq: Every day | ORAL | 0 refills | Status: DC

## 2023-05-10 MED ORDER — AMPHETAMINE-DEXTROAMPHETAMINE 5 MG PO TABS: 5.0000 mg | ORAL_TABLET | Freq: Every day | ORAL | 0 refills | Status: DC

## 2023-05-11 ENCOUNTER — Other Ambulatory Visit: Payer: Self-pay | Admitting: Physician Assistant

## 2023-05-11 DIAGNOSIS — F411 Generalized anxiety disorder: Secondary | ICD-10-CM

## 2023-05-15 DIAGNOSIS — R519 Headache, unspecified: Secondary | ICD-10-CM | POA: Diagnosis not present

## 2023-05-15 DIAGNOSIS — M62830 Muscle spasm of back: Secondary | ICD-10-CM | POA: Diagnosis not present

## 2023-05-15 DIAGNOSIS — M9903 Segmental and somatic dysfunction of lumbar region: Secondary | ICD-10-CM | POA: Diagnosis not present

## 2023-05-15 DIAGNOSIS — M9901 Segmental and somatic dysfunction of cervical region: Secondary | ICD-10-CM | POA: Diagnosis not present

## 2023-05-16 ENCOUNTER — Ambulatory Visit: Payer: BC Managed Care – PPO | Admitting: Physician Assistant

## 2023-05-16 ENCOUNTER — Other Ambulatory Visit: Payer: Self-pay | Admitting: Physician Assistant

## 2023-05-16 DIAGNOSIS — G43111 Migraine with aura, intractable, with status migrainosus: Secondary | ICD-10-CM

## 2023-05-21 DIAGNOSIS — R519 Headache, unspecified: Secondary | ICD-10-CM | POA: Diagnosis not present

## 2023-05-21 DIAGNOSIS — M9903 Segmental and somatic dysfunction of lumbar region: Secondary | ICD-10-CM | POA: Diagnosis not present

## 2023-05-21 DIAGNOSIS — M62830 Muscle spasm of back: Secondary | ICD-10-CM | POA: Diagnosis not present

## 2023-05-21 DIAGNOSIS — M9901 Segmental and somatic dysfunction of cervical region: Secondary | ICD-10-CM | POA: Diagnosis not present

## 2023-05-30 ENCOUNTER — Encounter: Payer: Self-pay | Admitting: Physician Assistant

## 2023-05-30 ENCOUNTER — Ambulatory Visit: Payer: BC Managed Care – PPO | Admitting: Physician Assistant

## 2023-05-30 VITALS — BP 130/95 | HR 78 | Temp 98.6°F | Resp 16 | Ht 78.0 in | Wt 315.4 lb

## 2023-05-30 DIAGNOSIS — E669 Obesity, unspecified: Secondary | ICD-10-CM | POA: Diagnosis not present

## 2023-05-30 DIAGNOSIS — F909 Attention-deficit hyperactivity disorder, unspecified type: Secondary | ICD-10-CM | POA: Diagnosis not present

## 2023-05-30 DIAGNOSIS — M5442 Lumbago with sciatica, left side: Secondary | ICD-10-CM

## 2023-05-30 DIAGNOSIS — F411 Generalized anxiety disorder: Secondary | ICD-10-CM

## 2023-05-30 MED ORDER — GABAPENTIN 100 MG PO CAPS: 100.0000 mg | ORAL_CAPSULE | Freq: Three times a day (TID) | ORAL | 3 refills | Status: DC

## 2023-05-30 MED ORDER — AMPHETAMINE-DEXTROAMPHETAMINE 5 MG PO TABS: 5.0000 mg | ORAL_TABLET | Freq: Every day | ORAL | 0 refills | Status: DC

## 2023-05-30 MED ORDER — AMPHETAMINE-DEXTROAMPHET ER 20 MG PO CP24: 20.0000 mg | ORAL_CAPSULE | Freq: Every day | ORAL | 0 refills | Status: DC

## 2023-05-30 MED ORDER — VENLAFAXINE HCL ER 37.5 MG PO CP24: 37.5000 mg | ORAL_CAPSULE | Freq: Every day | ORAL | 2 refills | Status: DC

## 2023-05-30 NOTE — Progress Notes (Signed)
[facility] [address] [place], [place] [address]  Internal MEDICINE  Office Visit Note  Patient Name: Ethan Gomez  [id number]  [id number]  Date of Service: 05/30/2023  Chief Complaint  Patient presents with   Follow-up   Gastroesophageal Reflux    HPI Pt is here for routine follow up -Adhd meds still working well -Unsure if wellbutrin helping. Wife reports some increased irritation. Will cut in half to start tapering and may stop if not improved on lower dose. Would like to go ahead and try effexor.  -migraines still improved, only having 2 bad ones per month -having some back pain since previously being in walking boot. Seeing chiropractor. Worried about a nerve due to pain down left leg. Having muscles spasms and is using muscle relaxer at night. Has had sciatica before on opposite side and has DDD in low back which previously required injections. Reports doing well with gabapentin previously and will try this now  Current Medication: Outpatient Encounter Medications as of 05/30/2023  Medication Sig   amoxicillin-clavulanate (AUGMENTIN) 875-125 MG tablet Take 1 tablet by mouth 2 (two) times daily. Take with food.   buPROPion (WELLBUTRIN XL) 300 MG 24 hr tablet TAKE 1 TABLET BY MOUTH EVERY DAY   cetirizine (ZYRTEC) 10 MG tablet Take 10 mg by mouth at bedtime.    gabapentin (NEURONTIN) 100 MG capsule Take 1 capsule (100 mg total) by mouth 3 (three) times daily.   meloxicam (MOBIC) 15 MG tablet Take 15 mg by mouth daily.   methocarbamol (ROBAXIN) 750 MG tablet TAKE 1 TABLET (750 MG TOTAL) BY MOUTH DAILY AS NEEDED FOR MUSCLE SPASMS   Multiple Vitamin (MULTIVITAMIN) tablet Take 1 tablet by mouth daily.   Rimegepant Sulfate (NURTEC) 75 MG TBDP Take one tablet by mouth daily as needed for migraine   topiramate (TOPAMAX) 50 MG tablet Take 1 tablet (50 mg total) by mouth 2 (two) times daily.   venlafaxine XR (EFFEXOR XR) 37.5 MG 24 hr capsule Take 1 capsule (37.5 mg total)  by mouth daily with breakfast.   [DISCONTINUED] amphetamine-dextroamphetamine (ADDERALL XR) 20 MG 24 hr capsule Take 1 capsule (20 mg total) by mouth daily.   [DISCONTINUED] amphetamine-dextroamphetamine (ADDERALL) 5 MG tablet Take 1 tablet (5 mg total) by mouth daily.   [START ON 06/08/2023] amphetamine-dextroamphetamine (ADDERALL XR) 20 MG 24 hr capsule Take 1 capsule (20 mg total) by mouth daily.   [START ON 06/08/2023] amphetamine-dextroamphetamine (ADDERALL) 5 MG tablet Take 1 tablet (5 mg total) by mouth daily.   No facility-administered encounter medications on file as of 05/30/2023.    Surgical History: Past Surgical History:  Procedure Laterality Date   BONE EXOSTOSIS EXCISION Left 01/27/2015   Procedure: EXCISION LEFT HEEL OS CALCANEAL SPUR ;  Surgeon: Loreta Ave, MD;  Location: [facility];  Service: Orthopedics;  Laterality: Left;  ANESTHESIA: GENERAL, BLOCK   left ankle surgery     TENDON REPAIR Left 01/27/2015   Procedure: REPAIR LEFT PERONEUS LONGUS TENDON;  Surgeon: Loreta Ave, MD;  Location: [facility];  Service: Orthopedics;  Laterality: Left;   VASECTOMY N/A 11/20/2019   Procedure: VASECTOMY;  Surgeon: Sondra Come, MD;  Location: [facility];  Service: Urology;  Laterality: N/A;    Medical History: Past Medical History:  Diagnosis Date   Acid reflux    OCC-TUMS PRN   Calcaneal spur of left foot 12/2014   Family history of adverse reaction to anesthesia    pt's mother has  hx. of post-op N/V   Migraines    MIGRAINES   Rupture of tendon 12/2014   left ankle and foot    Family History: Family History  Problem Relation Age of Onset   Anesthesia problems Mother        post-op N/V   Arthritis Mother    Hyperlipidemia Mother    Hypertension Mother    Arthritis Other        all 4 of his grandparents   Hyperlipidemia Other        all grandparents   Hypertension Maternal Grandmother    Hypertension Paternal Grandfather     Diabetes Maternal Grandfather     Social History   Socioeconomic History   Marital status: Married    Spouse name: Not on file   Number of children: Not on file   Years of education: Not on file   Highest education level: Not on file  Occupational History   Not on file  Tobacco Use   Smoking status: Never   Smokeless tobacco: Former   Tobacco comments:    no smokeless tobacco since age 57  Vaping Use   Vaping status: Never Used  Substance and Sexual Activity   Alcohol use: Yes    Alcohol/week: 0.0 standard drinks of alcohol    Comment: rare   Drug use: No   Sexual activity: Yes  Other Topics Concern   Not on file  Social History Narrative   Not on file   Social Determinants of Health   Financial Resource Strain: Not on file  Food Insecurity: Not on file  Transportation Needs: Not on file  Physical Activity: Not on file  Stress: Not on file  Social Connections: Not on file  Intimate Partner Violence: Not on file      Review of Systems  Constitutional:  Negative for fever.  HENT:  Negative for congestion, mouth sores and postnasal drip.   Eyes:  Negative for visual disturbance.  Respiratory:  Negative for cough.   Cardiovascular:  Negative for chest pain.  Genitourinary:  Negative for flank pain.  Musculoskeletal:  Positive for back pain.  Skin:  Negative for rash.  Neurological:  Negative for syncope.  Psychiatric/Behavioral:  Positive for dysphoric mood. The patient is nervous/anxious.     Vital Signs: BP (!) 130/95   Pulse 78   Temp 98.6 F (37 C)   Resp 16   Ht 6\' 6"  (1.981 m)   Wt (!) 315 lb 6.4 oz (143.1 kg)   SpO2 98%   BMI 36.45 kg/m    Physical Exam Vitals and nursing note reviewed.  Constitutional:      General: He is not in acute distress.    Appearance: Normal appearance. He is well-developed. He is obese. He is not diaphoretic.  HENT:     Head: Normocephalic and atraumatic.     Mouth/Throat:     Pharynx: No oropharyngeal  exudate.  Eyes:     Pupils: Pupils are equal, round, and reactive to light.  Neck:     Thyroid: No thyromegaly.     Vascular: No JVD.     Trachea: No tracheal deviation.  Cardiovascular:     Rate and Rhythm: Normal rate and regular rhythm.     Heart sounds: Normal heart sounds. No murmur heard.    No friction rub. No gallop.  Pulmonary:     Effort: Pulmonary effort is normal. No respiratory distress.     Breath sounds: No wheezing or rales.  Chest:     Chest wall: No tenderness.  Musculoskeletal:        General: Normal range of motion.     Cervical back: Normal range of motion and neck supple.  Lymphadenopathy:     Cervical: No cervical adenopathy.  Skin:    General: Skin is warm and dry.  Neurological:     Mental Status: He is alert and oriented to person, place, and time.     Cranial Nerves: No cranial nerve deficit.  Psychiatric:        Behavior: Behavior normal.        Thought Content: Thought content normal.        Judgment: Judgment normal.        Assessment/Plan: 1. Attention deficit hyperactivity disorder (ADHD), unspecified ADHD type May continue on adderall as before - amphetamine-dextroamphetamine (ADDERALL) 5 MG tablet; Take 1 tablet (5 mg total) by mouth daily.  Dispense: 30 tablet; Refill: 0 - amphetamine-dextroamphetamine (ADDERALL XR) 20 MG 24 hr capsule; Take 1 capsule (20 mg total) by mouth daily.  Dispense: 30 capsule; Refill: 0  Controlled Substance Database was reviewed by me for overdose risk score (ORS) Refilled Controlled medications today. Reviewed risks and possible side effects associated with taking Stimulants. Combination of these drugs with other psychotropic medications could cause dizziness and drowsiness. Pt needs to Monitor symptoms and exercise caution in driving and operating heavy machinery to avoid damages to oneself, to others and to the surroundings. Patient verbalized understanding in this matter. Dependence and abuse for these  drugs will be monitored closely. A Controlled substance policy and procedure is on file which allows [facility] to order a urine drug screen test at any visit. Patient understands and agrees with the plan..  2. GAD (generalized anxiety disorder) Will taper wellbutrin and start on effexor - venlafaxine XR (EFFEXOR XR) 37.5 MG 24 hr capsule; Take 1 capsule (37.5 mg total) by mouth daily with breakfast.  Dispense: 30 capsule; Refill: 2  3. Acute left-sided low back pain with left-sided sciatica May start on gabapentin, pt aware of potential S/E and increased grogginess especially with muscle relaxer  4. Obesity (BMI 30-39.9) Down another 5lbs, pt continuing to focus on diet and exercise in an effort to continue loss   General Counseling: Knute verbalizes understanding of the findings of todays visit and agrees with plan of treatment. I have discussed any further diagnostic evaluation that may be needed or ordered today. We also reviewed his medications today. he has been encouraged to call the office with any questions or concerns that should arise related to todays visit.    No orders of the defined types were placed in this encounter.   Meds ordered this encounter  Medications   amphetamine-dextroamphetamine (ADDERALL) 5 MG tablet    Sig: Take 1 tablet (5 mg total) by mouth daily.    Dispense:  30 tablet    Refill:  0   amphetamine-dextroamphetamine (ADDERALL XR) 20 MG 24 hr capsule    Sig: Take 1 capsule (20 mg total) by mouth daily.    Dispense:  30 capsule    Refill:  0   venlafaxine XR (EFFEXOR XR) 37.5 MG 24 hr capsule    Sig: Take 1 capsule (37.5 mg total) by mouth daily with breakfast.    Dispense:  30 capsule    Refill:  2   gabapentin (NEURONTIN) 100 MG capsule    Sig: Take 1 capsule (100 mg total) by mouth 3 (three) times daily.  Dispense:  90 capsule    Refill:  3    This patient was seen by Lynn Ito, PA-C in collaboration with Dr. Beverely Risen  as a part of collaborative care agreement.   Total time spent:30 Minutes Time spent includes review of chart, medications, test results, and follow up plan with the patient.      Dr Lyndon Code Internal medicine

## 2023-06-20 ENCOUNTER — Encounter: Payer: Self-pay | Admitting: Nurse Practitioner

## 2023-06-20 ENCOUNTER — Telehealth (INDEPENDENT_AMBULATORY_CARE_PROVIDER_SITE_OTHER): Payer: BC Managed Care – PPO | Admitting: Nurse Practitioner

## 2023-06-20 VITALS — Ht 78.0 in | Wt 308.0 lb

## 2023-06-20 DIAGNOSIS — R051 Acute cough: Secondary | ICD-10-CM

## 2023-06-20 DIAGNOSIS — J0100 Acute maxillary sinusitis, unspecified: Secondary | ICD-10-CM

## 2023-06-20 MED ORDER — HYDROCOD POLI-CHLORPHE POLI ER 10-8 MG/5ML PO SUER: 5.0000 mL | Freq: Two times a day (BID) | ORAL | 0 refills | Status: DC | PRN

## 2023-06-20 MED ORDER — DOXYCYCLINE HYCLATE 100 MG PO TABS: 100.0000 mg | ORAL_TABLET | Freq: Two times a day (BID) | ORAL | 0 refills | Status: AC

## 2023-06-20 NOTE — Progress Notes (Signed)
[facility] [address] [place], [place] [address]  Internal MEDICINE  Telephone Visit  Patient Name: Ethan Gomez  [id number]  [id number]  Date of Service: 06/20/2023  I connected with the patient at 0845 by telephone and verified the patients identity using two identifiers.   I discussed the limitations, risks, security and privacy concerns of performing an evaluation and management service by telephone and the availability of in person appointments. I also discussed with the patient that there may be a patient responsible charge related to the service.  The patient expressed understanding and agrees to proceed.    Chief Complaint  Patient presents with   Telephone Assessment   Telephone Screen   Cough    Going for few days    Headache   Fever   Sore Throat   Sinusitis    Cough Associated symptoms include ear pain, headaches, postnasal drip, rhinorrhea and a sore throat. Pertinent negatives include no chest pain, chills, fever, shortness of breath or wheezing.  Headache  Associated symptoms include coughing, ear pain, rhinorrhea, sinus pressure and a sore throat. Pertinent negatives include no fever.  Fever  Associated symptoms include congestion, coughing, ear pain, headaches and a sore throat. Pertinent negatives include no chest pain or wheezing.  Sore Throat  Associated symptoms include congestion, coughing, ear pain and headaches. Pertinent negatives include no shortness of breath.  Sinusitis Associated symptoms include congestion, coughing, ear pain, headaches, sinus pressure and a sore throat. Pertinent negatives include no chills or shortness of breath.   Ethan Gomez presents for a telehealth virtual visit for upper respiratory infection Symptom onset was 3-4 days ago Sputum is dark green and thick Reports cough, headache, sore throat, sinus pressure/pain, chest tightness, ear pressure, postnasal drip Denies any SOB or wheezing    Current  Medication: Outpatient Encounter Medications as of 06/20/2023  Medication Sig   amoxicillin-clavulanate (AUGMENTIN) 875-125 MG tablet Take 1 tablet by mouth 2 (two) times daily. Take with food.   amphetamine-dextroamphetamine (ADDERALL XR) 20 MG 24 hr capsule Take 1 capsule (20 mg total) by mouth daily.   amphetamine-dextroamphetamine (ADDERALL) 5 MG tablet Take 1 tablet (5 mg total) by mouth daily.   buPROPion (WELLBUTRIN XL) 300 MG 24 hr tablet TAKE 1 TABLET BY MOUTH EVERY DAY   cetirizine (ZYRTEC) 10 MG tablet Take 10 mg by mouth at bedtime.    chlorpheniramine-HYDROcodone (TUSSIONEX) 10-8 MG/5ML Take 5 mLs by mouth every 12 (twelve) hours as needed for cough.   doxycycline (VIBRA-TABS) 100 MG tablet Take 1 tablet (100 mg total) by mouth 2 (two) times daily for 10 days. Take with food.   gabapentin (NEURONTIN) 100 MG capsule Take 1 capsule (100 mg total) by mouth 3 (three) times daily.   meloxicam (MOBIC) 15 MG tablet Take 15 mg by mouth daily.   methocarbamol (ROBAXIN) 750 MG tablet TAKE 1 TABLET (750 MG TOTAL) BY MOUTH DAILY AS NEEDED FOR MUSCLE SPASMS   Multiple Vitamin (MULTIVITAMIN) tablet Take 1 tablet by mouth daily.   Rimegepant Sulfate (NURTEC) 75 MG TBDP Take one tablet by mouth daily as needed for migraine   topiramate (TOPAMAX) 50 MG tablet Take 1 tablet (50 mg total) by mouth 2 (two) times daily.   venlafaxine XR (EFFEXOR XR) 37.5 MG 24 hr capsule Take 1 capsule (37.5 mg total) by mouth daily with breakfast.   No facility-administered encounter medications on file as of 06/20/2023.    Surgical History: Past Surgical History:  Procedure Laterality Date   BONE  EXOSTOSIS EXCISION Left 01/27/2015   Procedure: EXCISION LEFT HEEL OS CALCANEAL SPUR ;  Surgeon: Loreta Ave, MD;  Location: [facility];  Service: Orthopedics;  Laterality: Left;  ANESTHESIA: GENERAL, BLOCK   left ankle surgery     TENDON REPAIR Left 01/27/2015   Procedure: REPAIR LEFT PERONEUS LONGUS  TENDON;  Surgeon: Loreta Ave, MD;  [facility];  Service: Orthopedics;  Laterality: Left;   VASECTOMY N/A 11/20/2019   Procedure: VASECTOMY;  Surgeon: Sondra Come, MD;  Location: [facility];  Service: Urology;  Laterality: N/A;    Medical History: Past Medical History:  Diagnosis Date   Acid reflux    OCC-TUMS PRN   Calcaneal spur of left foot 12/2014   Family history of adverse reaction to anesthesia    pt's mother has hx. of post-op N/V   Migraines    MIGRAINES   Rupture of tendon 12/2014   left ankle and foot    Family History: Family History  Problem Relation Age of Onset   Anesthesia problems Mother        post-op N/V   Arthritis Mother    Hyperlipidemia Mother    Hypertension Mother    Arthritis Other        all 4 of his grandparents   Hyperlipidemia Other        all grandparents   Hypertension Maternal Grandmother    Hypertension Paternal Grandfather    Diabetes Maternal Grandfather     Social History   Socioeconomic History   Marital status: Married    Spouse name: Not on file   Number of children: Not on file   Years of education: Not on file   Highest education level: Not on file  Occupational History   Not on file  Tobacco Use   Smoking status: Never   Smokeless tobacco: Former   Tobacco comments:    no smokeless tobacco since age 51  Vaping Use   Vaping status: Never Used  Substance and Sexual Activity   Alcohol use: Yes    Alcohol/week: 0.0 standard drinks of alcohol    Comment: rare   Drug use: No   Sexual activity: Yes  Other Topics Concern   Not on file  Social History Narrative   Not on file   Social Drivers of Health   Financial Resource Strain: Not on file  Food Insecurity: Not on file  Transportation Needs: Not on file  Physical Activity: Not on file  Stress: Not on file  Social Connections: Not on file  Intimate Partner Violence: Not on file      Review of Systems  Constitutional:  Positive  for appetite change and fatigue. Negative for chills and fever.  HENT:  Positive for congestion, ear pain, postnasal drip, rhinorrhea, sinus pressure, sinus pain and sore throat.   Respiratory:  Positive for cough and chest tightness. Negative for shortness of breath and wheezing.   Cardiovascular: Negative.  Negative for chest pain and palpitations.  Gastrointestinal: Negative.   Neurological:  Positive for headaches.    Vital Signs: Ht 6\' 6"  (1.981 m)   Wt (!) 308 lb (139.7 kg)   BMI 35.59 kg/m    Observation/Objective: He is alert and oriented. No acute distress noted.     Assessment/Plan: 1. Acute non-recurrent maxillary sinusitis (Primary) Take doxycycline as prescribed, until gone  - doxycycline (VIBRA-TABS) 100 MG tablet; Take 1 tablet (100 mg total) by mouth 2 (two) times daily for 10  days. Take with food.  Dispense: 20 tablet; Refill: 0 - chlorpheniramine-HYDROcodone (TUSSIONEX) 10-8 MG/5ML; Take 5 mLs by mouth every 12 (twelve) hours as needed for cough.  Dispense: 140 mL; Refill: 0  2. Acute cough Prn cough medication prescribed for symptomatic relief. - chlorpheniramine-HYDROcodone (TUSSIONEX) 10-8 MG/5ML; Take 5 mLs by mouth every 12 (twelve) hours as needed for cough.  Dispense: 140 mL; Refill: 0   General Counseling: Ethan Gomez verbalizes understanding of the findings of today's phone visit and agrees with plan of treatment. I have discussed any further diagnostic evaluation that may be needed or ordered today. We also reviewed his medications today. he has been encouraged to call the office with any questions or concerns that should arise related to todays visit.  Return if symptoms worsen or fail to improve.   No orders of the defined types were placed in this encounter.   Meds ordered this encounter  Medications   doxycycline (VIBRA-TABS) 100 MG tablet    Sig: Take 1 tablet (100 mg total) by mouth 2 (two) times daily for 10 days. Take with food.    Dispense:   20 tablet    Refill:  0    Please fill new script asap thanks   chlorpheniramine-HYDROcodone (TUSSIONEX) 10-8 MG/5ML    Sig: Take 5 mLs by mouth every 12 (twelve) hours as needed for cough.    Dispense:  140 mL    Refill:  0    Please fill new script today    Time spent:10 Minutes Time spent with patient included reviewing progress notes, labs, imaging studies, and discussing plan for follow up.  [place] Controlled Substance Database was reviewed by me for overdose risk score (ORS) if appropriate.  This patient was seen by Sallyanne Kuster, FNP-C in collaboration with Dr. Beverely Risen as a part of collaborative care agreement.  Yanni Ruberg R. Tedd Sias, MSN, FNP-C Internal medicine

## 2023-06-22 ENCOUNTER — Other Ambulatory Visit: Payer: Self-pay | Admitting: Physician Assistant

## 2023-06-22 DIAGNOSIS — F411 Generalized anxiety disorder: Secondary | ICD-10-CM

## 2023-06-27 ENCOUNTER — Telehealth: Payer: Self-pay

## 2023-06-27 DIAGNOSIS — R051 Acute cough: Secondary | ICD-10-CM

## 2023-06-27 DIAGNOSIS — J0100 Acute maxillary sinusitis, unspecified: Secondary | ICD-10-CM

## 2023-06-27 MED ORDER — HYDROCOD POLI-CHLORPHE POLI ER 10-8 MG/5ML PO SUER
5.0000 mL | Freq: Two times a day (BID) | ORAL | 0 refills | Status: DC | PRN
Start: 2023-06-27 — End: 2023-08-29

## 2023-06-27 NOTE — Telephone Encounter (Signed)
 Spoke with pt advised that we ordered chest xray and also send Tussionex

## 2023-07-10 ENCOUNTER — Other Ambulatory Visit: Payer: Self-pay | Admitting: Physician Assistant

## 2023-07-10 ENCOUNTER — Telehealth: Payer: Self-pay

## 2023-07-10 DIAGNOSIS — F909 Attention-deficit hyperactivity disorder, unspecified type: Secondary | ICD-10-CM

## 2023-07-10 MED ORDER — AMPHETAMINE-DEXTROAMPHETAMINE 5 MG PO TABS: 5.0000 mg | ORAL_TABLET | Freq: Every day | ORAL | 0 refills | Status: DC

## 2023-07-10 MED ORDER — AMPHETAMINE-DEXTROAMPHET ER 20 MG PO CP24: 20.0000 mg | ORAL_CAPSULE | Freq: Every day | ORAL | 0 refills | Status: DC

## 2023-07-10 NOTE — Telephone Encounter (Signed)
 Try to call pt  that we sent med unable to leave message

## 2023-07-18 ENCOUNTER — Encounter: Payer: Self-pay | Admitting: Physician Assistant

## 2023-07-18 ENCOUNTER — Ambulatory Visit: Payer: BC Managed Care – PPO | Admitting: Physician Assistant

## 2023-07-18 VITALS — BP 120/88 | HR 84 | Temp 98.5°F | Resp 16 | Ht 78.0 in | Wt 311.0 lb

## 2023-07-18 DIAGNOSIS — F411 Generalized anxiety disorder: Secondary | ICD-10-CM

## 2023-07-18 DIAGNOSIS — G43111 Migraine with aura, intractable, with status migrainosus: Secondary | ICD-10-CM | POA: Diagnosis not present

## 2023-07-18 DIAGNOSIS — F909 Attention-deficit hyperactivity disorder, unspecified type: Secondary | ICD-10-CM

## 2023-07-18 MED ORDER — VENLAFAXINE HCL ER 75 MG PO CP24: 75.0000 mg | ORAL_CAPSULE | Freq: Every day | ORAL | 2 refills | Status: DC

## 2023-07-18 NOTE — Progress Notes (Signed)
[facility] [address] [place], [place] [address]  Internal MEDICINE  Office Visit Note  Patient Name: Ethan Gomez  [id number]  [id number]  Date of Service: 07/24/2023  Chief Complaint  Patient presents with   Follow-up   Gastroesophageal Reflux    HPI Pt is here for routine follow up -Down 4lbs since last visit, however he is wearing steel toe boots so weight likely down much further -Doing well with effexor and finds it helps with stress, would like to increase dose to see if it helps even more -stopping wellbutrin helped the outbursts he was having -migraine currently, annoying and lingering for a week. Tried nurtec and methocarbamol but still present. Had been doing better overall with migraines, before this one. Knows to go to ED if sudden changes or worsening. States he is able to function and work with current migraine, just has been dull lingering this time. Does not want to change topamax currently as he states he had been doing well until this current episode. -adderral dosing working well -gabapentin helping low back pain, taking 1 cap at night, occasionally 1 in AM when headache too  Current Medication: Outpatient Encounter Medications as of 07/18/2023  Medication Sig   amoxicillin-clavulanate (AUGMENTIN) 875-125 MG tablet Take 1 tablet by mouth 2 (two) times daily. Take with food.   amphetamine-dextroamphetamine (ADDERALL XR) 20 MG 24 hr capsule Take 1 capsule (20 mg total) by mouth daily.   amphetamine-dextroamphetamine (ADDERALL) 5 MG tablet Take 1 tablet (5 mg total) by mouth daily.   buPROPion (WELLBUTRIN XL) 300 MG 24 hr tablet TAKE 1 TABLET BY MOUTH EVERY DAY   cetirizine (ZYRTEC) 10 MG tablet Take 10 mg by mouth at bedtime.    chlorpheniramine-HYDROcodone (TUSSIONEX) 10-8 MG/5ML Take 5 mLs by mouth every 12 (twelve) hours as needed for cough.   gabapentin (NEURONTIN) 100 MG capsule Take 1 capsule (100 mg total) by mouth 3 (three) times daily.    meloxicam (MOBIC) 15 MG tablet Take 15 mg by mouth daily.   methocarbamol (ROBAXIN) 750 MG tablet TAKE 1 TABLET (750 MG TOTAL) BY MOUTH DAILY AS NEEDED FOR MUSCLE SPASMS   Multiple Vitamin (MULTIVITAMIN) tablet Take 1 tablet by mouth daily.   Rimegepant Sulfate (NURTEC) 75 MG TBDP Take one tablet by mouth daily as needed for migraine   topiramate (TOPAMAX) 50 MG tablet Take 1 tablet (50 mg total) by mouth 2 (two) times daily.   venlafaxine XR (EFFEXOR XR) 75 MG 24 hr capsule Take 1 capsule (75 mg total) by mouth daily with breakfast.   [DISCONTINUED] venlafaxine XR (EFFEXOR-XR) 37.5 MG 24 hr capsule TAKE 1 CAPSULE BY MOUTH DAILY WITH BREAKFAST.   No facility-administered encounter medications on file as of 07/18/2023.    Surgical History: Past Surgical History:  Procedure Laterality Date   BONE EXOSTOSIS EXCISION Left 01/27/2015   Procedure: EXCISION LEFT HEEL OS CALCANEAL SPUR ;  Surgeon: Loreta Ave, MD;  Location: [facility];  Service: Orthopedics;  Laterality: Left;  ANESTHESIA: GENERAL, BLOCK   left ankle surgery     TENDON REPAIR Left 01/27/2015   Procedure: REPAIR LEFT PERONEUS LONGUS TENDON;  Surgeon: Loreta Ave, MD;  Location: [facility];  Service: Orthopedics;  Laterality: Left;   VASECTOMY N/A 11/20/2019   Procedure: VASECTOMY;  Surgeon: Sondra Come, MD;  Location: [facility];  Service: Urology;  Laterality: N/A;    Medical History: Past Medical History:  Diagnosis Date   Acid reflux  OCC-TUMS PRN   Calcaneal spur of left foot 12/2014   Family history of adverse reaction to anesthesia    pt's mother has hx. of post-op N/V   Migraines    MIGRAINES   Rupture of tendon 12/2014   left ankle and foot    Family History: Family History  Problem Relation Age of Onset   Anesthesia problems Mother        post-op N/V   Arthritis Mother    Hyperlipidemia Mother    Hypertension Mother    Arthritis Other        all 4 of his grandparents    Hyperlipidemia Other        all grandparents   Hypertension Maternal Grandmother    Hypertension Paternal Grandfather    Diabetes Maternal Grandfather     Social History   Socioeconomic History   Marital status: Married    Spouse name: Not on file   Number of children: Not on file   Years of education: Not on file   Highest education level: Not on file  Occupational History   Not on file  Tobacco Use   Smoking status: Never   Smokeless tobacco: Former   Tobacco comments:    no smokeless tobacco since age 64  Vaping Use   Vaping status: Never Used  Substance and Sexual Activity   Alcohol use: Yes    Alcohol/week: 0.0 standard drinks of alcohol    Comment: rare   Drug use: No   Sexual activity: Yes  Other Topics Concern   Not on file  Social History Narrative   Not on file   Social Drivers of Health   Financial Resource Strain: Not on file  Food Insecurity: Not on file  Transportation Needs: Not on file  Physical Activity: Not on file  Stress: Not on file  Social Connections: Not on file  Intimate Partner Violence: Not on file      Review of Systems  Constitutional:  Negative for fever.  HENT:  Negative for congestion, mouth sores and postnasal drip.   Eyes:  Negative for visual disturbance.  Respiratory:  Negative for cough.   Cardiovascular:  Negative for chest pain.  Genitourinary:  Negative for flank pain.  Musculoskeletal:  Positive for back pain.  Skin:  Negative for rash.  Neurological:  Positive for headaches. Negative for syncope.  Psychiatric/Behavioral:  Negative for dysphoric mood. The patient is nervous/anxious.     Vital Signs: BP 120/88 Comment: 152/98  Pulse 84   Temp 98.5 F (36.9 C)   Resp 16   Ht 6\' 6"  (1.981 m)   Wt (!) 311 lb (141.1 kg)   SpO2 97%   BMI 35.94 kg/m    Physical Exam Vitals and nursing note reviewed.  Constitutional:      General: He is not in acute distress.    Appearance: Normal appearance. He is  well-developed. He is obese. He is not diaphoretic.  HENT:     Head: Normocephalic and atraumatic.     Mouth/Throat:     Pharynx: No oropharyngeal exudate.  Eyes:     Pupils: Pupils are equal, round, and reactive to light.  Neck:     Thyroid: No thyromegaly.     Vascular: No JVD.     Trachea: No tracheal deviation.  Cardiovascular:     Rate and Rhythm: Normal rate and regular rhythm.     Heart sounds: Normal heart sounds. No murmur heard.    No friction rub. No  gallop.  Pulmonary:     Effort: Pulmonary effort is normal.  Musculoskeletal:        General: Normal range of motion.     Cervical back: Normal range of motion and neck supple.  Lymphadenopathy:     Cervical: No cervical adenopathy.  Skin:    General: Skin is warm and dry.  Neurological:     Mental Status: He is alert and oriented to person, place, and time.     Cranial Nerves: No cranial nerve deficit.  Psychiatric:        Behavior: Behavior normal.        Thought Content: Thought content normal.        Judgment: Judgment normal.        Assessment/Plan: 1. GAD (generalized anxiety disorder) (Primary) Improved, will increase dose for further effect. - venlafaxine XR (EFFEXOR XR) 75 MG 24 hr capsule; Take 1 capsule (75 mg total) by mouth daily with breakfast.  Dispense: 30 capsule; Refill: 2  2. Attention deficit hyperactivity disorder (ADHD), unspecified ADHD type Doing well with current adderall dosing and may call when refill due  3. Intractable migraine with aura with status migrainosus Current migraine, but overall improved and will continue current medications   General Counseling: Yunior verbalizes understanding of the findings of todays visit and agrees with plan of treatment. I have discussed any further diagnostic evaluation that may be needed or ordered today. We also reviewed his medications today. he has been encouraged to call the office with any questions or concerns that should arise related to  todays visit.    No orders of the defined types were placed in this encounter.   Meds ordered this encounter  Medications   venlafaxine XR (EFFEXOR XR) 75 MG 24 hr capsule    Sig: Take 1 capsule (75 mg total) by mouth daily with breakfast.    Dispense:  30 capsule    Refill:  2    This patient was seen by Lynn Ito, PA-C in collaboration with Dr. Beverely Risen as a part of collaborative care agreement.   Total time spent:30 Minutes Time spent includes review of chart, medications, test results, and follow up plan with the patient.      Dr Lyndon Code Internal medicine

## 2023-08-02 ENCOUNTER — Other Ambulatory Visit: Payer: Self-pay | Admitting: Physician Assistant

## 2023-08-02 DIAGNOSIS — G43111 Migraine with aura, intractable, with status migrainosus: Secondary | ICD-10-CM

## 2023-08-12 ENCOUNTER — Other Ambulatory Visit: Payer: Self-pay | Admitting: Physician Assistant

## 2023-08-12 ENCOUNTER — Telehealth: Payer: Self-pay

## 2023-08-12 DIAGNOSIS — G43111 Migraine with aura, intractable, with status migrainosus: Secondary | ICD-10-CM

## 2023-08-12 DIAGNOSIS — F909 Attention-deficit hyperactivity disorder, unspecified type: Secondary | ICD-10-CM

## 2023-08-12 DIAGNOSIS — F411 Generalized anxiety disorder: Secondary | ICD-10-CM

## 2023-08-12 MED ORDER — AMPHETAMINE-DEXTROAMPHET ER 20 MG PO CP24
20.0000 mg | ORAL_CAPSULE | Freq: Every day | ORAL | 0 refills | Status: DC
Start: 2023-08-12 — End: 2023-08-29

## 2023-08-12 MED ORDER — AMPHETAMINE-DEXTROAMPHETAMINE 5 MG PO TABS
5.0000 mg | ORAL_TABLET | Freq: Every day | ORAL | 0 refills | Status: DC
Start: 2023-08-12 — End: 2023-08-29

## 2023-08-12 NOTE — Telephone Encounter (Signed)
 Try to call pt voice mail full

## 2023-08-12 NOTE — Telephone Encounter (Signed)
 done

## 2023-08-13 ENCOUNTER — Telehealth: Payer: Self-pay | Admitting: Physician Assistant

## 2023-08-13 NOTE — Telephone Encounter (Signed)
MB full, sent mychart msg to change 08/15/23 appointment to virtual-Ethan Gomez

## 2023-08-15 ENCOUNTER — Ambulatory Visit: Payer: BC Managed Care – PPO | Admitting: Physician Assistant

## 2023-08-29 ENCOUNTER — Encounter: Payer: Self-pay | Admitting: Physician Assistant

## 2023-08-29 ENCOUNTER — Ambulatory Visit (INDEPENDENT_AMBULATORY_CARE_PROVIDER_SITE_OTHER): Payer: BC Managed Care – PPO | Admitting: Physician Assistant

## 2023-08-29 VITALS — BP 122/82 | HR 82 | Temp 97.8°F | Resp 16 | Ht 78.0 in | Wt 312.2 lb

## 2023-08-29 DIAGNOSIS — F411 Generalized anxiety disorder: Secondary | ICD-10-CM | POA: Diagnosis not present

## 2023-08-29 DIAGNOSIS — F909 Attention-deficit hyperactivity disorder, unspecified type: Secondary | ICD-10-CM

## 2023-08-29 DIAGNOSIS — G43111 Migraine with aura, intractable, with status migrainosus: Secondary | ICD-10-CM

## 2023-08-29 DIAGNOSIS — E669 Obesity, unspecified: Secondary | ICD-10-CM

## 2023-08-29 DIAGNOSIS — E782 Mixed hyperlipidemia: Secondary | ICD-10-CM | POA: Diagnosis not present

## 2023-08-29 DIAGNOSIS — Z1329 Encounter for screening for other suspected endocrine disorder: Secondary | ICD-10-CM | POA: Diagnosis not present

## 2023-08-29 DIAGNOSIS — R5383 Other fatigue: Secondary | ICD-10-CM

## 2023-08-29 MED ORDER — AMPHETAMINE-DEXTROAMPHET ER 20 MG PO CP24: 20.0000 mg | ORAL_CAPSULE | Freq: Every day | ORAL | 0 refills | Status: DC

## 2023-08-29 MED ORDER — AMPHETAMINE-DEXTROAMPHETAMINE 5 MG PO TABS: 5.0000 mg | ORAL_TABLET | Freq: Every day | ORAL | 0 refills | Status: DC

## 2023-08-29 NOTE — Progress Notes (Signed)
 [facility] [address] [place], [place] [address]  Internal MEDICINE  Office Visit Note  Patient Name: Ethan Gomez  [id number]  [id number]  Date of Service: 09/13/2023  Chief Complaint  Patient presents with   Follow-up   ADHD    HPI Pt is here for routine follow up -Occasional migraines--one today, has not taken nurtec yet today. Did offer alternative nasal spray option to try--Zavzpret, Advised not to combine with nurtec in 24 hour period. -Effexor doing well on higher dose -No S/E on adderall as long as not combining with caffeine  -Still working on wt loss, not coming off as quickly. Is putting on more muscle though -back doing better on gabapentin -due for CPE and labs  Current Medication: Outpatient Encounter Medications as of 08/29/2023  Medication Sig   cetirizine (ZYRTEC) 10 MG tablet Take 10 mg by mouth at bedtime.    gabapentin (NEURONTIN) 100 MG capsule Take 1 capsule (100 mg total) by mouth 3 (three) times daily.   meloxicam (MOBIC) 15 MG tablet Take 15 mg by mouth daily.   methocarbamol (ROBAXIN) 750 MG tablet TAKE 1 TABLET (750 MG TOTAL) BY MOUTH DAILY AS NEEDED FOR MUSCLE SPASMS   Multiple Vitamin (MULTIVITAMIN) tablet Take 1 tablet by mouth daily.   NURTEC 75 MG TBDP TAKE ONE TABLET BY MOUTH DAILY AS NEEDED FOR MIGRAINE   topiramate (TOPAMAX) 50 MG tablet Take 1 tablet (50 mg total) by mouth 2 (two) times daily.   [DISCONTINUED] amoxicillin-clavulanate (AUGMENTIN) 875-125 MG tablet Take 1 tablet by mouth 2 (two) times daily. Take with food.   [DISCONTINUED] amphetamine-dextroamphetamine (ADDERALL XR) 20 MG 24 hr capsule Take 1 capsule (20 mg total) by mouth daily.   [DISCONTINUED] amphetamine-dextroamphetamine (ADDERALL) 5 MG tablet Take 1 tablet (5 mg total) by mouth daily.   [DISCONTINUED] buPROPion (WELLBUTRIN XL) 300 MG 24 hr tablet TAKE 1 TABLET BY MOUTH EVERY DAY   [DISCONTINUED] venlafaxine XR (EFFEXOR XR) 75 MG 24 hr capsule Take 1 capsule  (75 mg total) by mouth daily with breakfast.   amphetamine-dextroamphetamine (ADDERALL XR) 20 MG 24 hr capsule Take 1 capsule (20 mg total) by mouth daily.   amphetamine-dextroamphetamine (ADDERALL) 5 MG tablet Take 1 tablet (5 mg total) by mouth daily.   [DISCONTINUED] chlorpheniramine-HYDROcodone (TUSSIONEX) 10-8 MG/5ML Take 5 mLs by mouth every 12 (twelve) hours as needed for cough. (Patient not taking: Reported on 08/29/2023)   No facility-administered encounter medications on file as of 08/29/2023.    Surgical History: Past Surgical History:  Procedure Laterality Date   BONE EXOSTOSIS EXCISION Left 01/27/2015   Procedure: EXCISION LEFT HEEL OS CALCANEAL SPUR ;  Surgeon: Loreta Ave, MD;  Location: [facility];  Service: Orthopedics;  Laterality: Left;  ANESTHESIA: GENERAL, BLOCK   left ankle surgery     TENDON REPAIR Left 01/27/2015   Procedure: REPAIR LEFT PERONEUS LONGUS TENDON;  Surgeon: Loreta Ave, MD;  Location: [facility];  Service: Orthopedics;  Laterality: Left;   VASECTOMY N/A 11/20/2019   Procedure: VASECTOMY;  Surgeon: Sondra Come, MD;  Location: [facility];  Service: Urology;  Laterality: N/A;    Medical History: Past Medical History:  Diagnosis Date   Acid reflux    OCC-TUMS PRN   ADHD (attention deficit hyperactivity disorder)    Calcaneal spur of left foot 12/2014   Family history of adverse reaction to anesthesia    pt's mother has hx. of post-op N/V   Migraines  MIGRAINES   Rupture of tendon 12/2014   left ankle and foot    Family History: Family History  Problem Relation Age of Onset   Anesthesia problems Mother        post-op N/V   Arthritis Mother    Hyperlipidemia Mother    Hypertension Mother    Arthritis Other        all 4 of his grandparents   Hyperlipidemia Other        all grandparents   Hypertension Maternal Grandmother    Hypertension Paternal Grandfather    Diabetes Maternal Grandfather     Social  History   Socioeconomic History   Marital status: Married    Spouse name: Not on file   Number of children: Not on file   Years of education: Not on file   Highest education level: Not on file  Occupational History   Not on file  Tobacco Use   Smoking status: Never   Smokeless tobacco: Former   Tobacco comments:    no smokeless tobacco since age 36  Vaping Use   Vaping status: Never Used  Substance and Sexual Activity   Alcohol use: Yes    Alcohol/week: 0.0 standard drinks of alcohol    Comment: rare   Drug use: No   Sexual activity: Yes  Other Topics Concern   Not on file  Social History Narrative   Not on file   Social Drivers of Health   Financial Resource Strain: Not on file  Food Insecurity: Not on file  Transportation Needs: Not on file  Physical Activity: Not on file  Stress: Not on file  Social Connections: Not on file  Intimate Partner Violence: Not on file      Review of Systems  Constitutional:  Negative for fever.  HENT:  Negative for congestion, mouth sores and postnasal drip.   Eyes:  Negative for visual disturbance.  Respiratory:  Negative for cough.   Cardiovascular:  Negative for chest pain.  Genitourinary:  Negative for flank pain.  Musculoskeletal:  Positive for back pain.  Skin:  Negative for rash.  Neurological:  Positive for headaches. Negative for syncope.  Psychiatric/Behavioral:  Negative for dysphoric mood. The patient is not nervous/anxious.     Vital Signs: BP 122/82   Pulse 82   Temp 97.8 F (36.6 C)   Resp 16   Ht 6\' 6"  (1.981 m)   Wt (!) 312 lb 3.2 oz (141.6 kg)   SpO2 98%   BMI 36.08 kg/m    Physical Exam Vitals and nursing note reviewed.  Constitutional:      General: He is not in acute distress.    Appearance: Normal appearance. He is well-developed. He is obese. He is not diaphoretic.  HENT:     Head: Normocephalic and atraumatic.     Mouth/Throat:     Pharynx: No oropharyngeal exudate.  Eyes:     Pupils:  Pupils are equal, round, and reactive to light.  Neck:     Thyroid: No thyromegaly.     Vascular: No JVD.     Trachea: No tracheal deviation.  Cardiovascular:     Rate and Rhythm: Normal rate and regular rhythm.     Heart sounds: Normal heart sounds. No murmur heard.    No friction rub. No gallop.  Pulmonary:     Effort: Pulmonary effort is normal.  Musculoskeletal:        General: Normal range of motion.     Cervical back: Normal  range of motion and neck supple.  Lymphadenopathy:     Cervical: No cervical adenopathy.  Skin:    General: Skin is warm and dry.  Neurological:     Mental Status: He is alert and oriented to person, place, and time.     Cranial Nerves: No cranial nerve deficit.  Psychiatric:        Behavior: Behavior normal.        Thought Content: Thought content normal.        Judgment: Judgment normal.        Assessment/Plan: 1. Attention deficit hyperactivity disorder (ADHD), unspecified ADHD type (Primary) Doing well with adderall and may continue, future refill sent - amphetamine-dextroamphetamine (ADDERALL) 5 MG tablet; Take 1 tablet (5 mg total) by mouth daily.  Dispense: 30 tablet; Refill: 0 - amphetamine-dextroamphetamine (ADDERALL XR) 20 MG 24 hr capsule; Take 1 capsule (20 mg total) by mouth daily.  Dispense: 30 capsule; Refill: 0 [place] Controlled Substance Database was reviewed by me for overdose risk score (ORS) Refilled Controlled medications today. Reviewed risks and possible side effects associated with taking Stimulants. Combination of these drugs with other psychotropic medications could cause dizziness and drowsiness. Pt needs to Monitor symptoms and exercise caution in driving and operating heavy machinery to avoid damages to oneself, to others and to the surroundings. Patient verbalized understanding in this matter. Dependence and abuse for these drugs will be monitored closely. A Controlled substance policy and procedure is on file which allows [facility] to order a urine drug screen test at any visit. Patient understands and agrees with the plan..  2. Intractable migraine with aura with status migrainosus Will continue effexor and topamax, may continue nurtec prn. Given sample of zavzpret to try for acute migraine use but advised on possible S/E and not to combine with nurtec  3. GAD (generalized anxiety disorder) Continue effexor as before  4. Thyroid disorder screen - TSH + free T4  5. Mixed hyperlipidemia - Lipid Panel With LDL/HDL Ratio  6. Other fatigue - CBC w/Diff/Platelet - Comprehensive metabolic panel  7. Obesity (BMI 30-39.9) Continue to work on diet and exercise   General Counseling: edvin albus understanding of the findings of todays visit and agrees with plan of treatment. I have discussed any further diagnostic evaluation that may be needed or ordered today. We also reviewed his medications today. he has been encouraged to call the office with any questions or concerns that should arise related to todays visit.    Orders Placed This Encounter  Procedures   TSH + free T4   Lipid Panel With LDL/HDL Ratio   CBC w/Diff/Platelet   Comprehensive metabolic panel    Meds ordered this encounter  Medications   amphetamine-dextroamphetamine (ADDERALL) 5 MG tablet    Sig: Take 1 tablet (5 mg total) by mouth daily.    Dispense:  30 tablet    Refill:  0   amphetamine-dextroamphetamine (ADDERALL XR) 20 MG 24 hr capsule    Sig: Take 1 capsule (20 mg total) by mouth daily.    Dispense:  30 capsule    Refill:  0    This patient was seen by Lynn Ito, PA-C in collaboration with Dr. Beverely Risen as a part of collaborative care agreement.   Total time spent:30 Minutes Time spent includes review of chart, medications, test results, and follow up plan with the patient.      Dr Lyndon Code Internal medicine

## 2023-09-12 ENCOUNTER — Other Ambulatory Visit: Payer: Self-pay | Admitting: Physician Assistant

## 2023-09-12 DIAGNOSIS — F411 Generalized anxiety disorder: Secondary | ICD-10-CM

## 2023-09-13 ENCOUNTER — Telehealth: Payer: Self-pay

## 2023-09-13 DIAGNOSIS — Z6834 Body mass index (BMI) 34.0-34.9, adult: Secondary | ICD-10-CM | POA: Diagnosis not present

## 2023-09-13 DIAGNOSIS — J0140 Acute pansinusitis, unspecified: Secondary | ICD-10-CM | POA: Diagnosis not present

## 2023-09-13 NOTE — Telephone Encounter (Signed)
 Patient was notified about his PA for Nurtec was approved.

## 2023-09-25 DIAGNOSIS — E782 Mixed hyperlipidemia: Secondary | ICD-10-CM | POA: Diagnosis not present

## 2023-09-25 DIAGNOSIS — Z1329 Encounter for screening for other suspected endocrine disorder: Secondary | ICD-10-CM | POA: Diagnosis not present

## 2023-09-25 DIAGNOSIS — R5383 Other fatigue: Secondary | ICD-10-CM | POA: Diagnosis not present

## 2023-09-26 LAB — COMPREHENSIVE METABOLIC PANEL WITH GFR
ALT: 19 IU/L (ref 0–44)
AST: 19 IU/L (ref 0–40)
Albumin: 4.5 g/dL (ref 4.1–5.1)
Alkaline Phosphatase: 83 IU/L (ref 44–121)
BUN/Creatinine Ratio: 18 (ref 9–20)
BUN: 15 mg/dL (ref 6–20)
Bilirubin Total: 0.3 mg/dL (ref 0.0–1.2)
CO2: 22 mmol/L (ref 20–29)
Calcium: 9.9 mg/dL (ref 8.7–10.2)
Chloride: 101 mmol/L (ref 96–106)
Creatinine, Ser: 0.82 mg/dL (ref 0.76–1.27)
Globulin, Total: 2.9 g/dL (ref 1.5–4.5)
Glucose: 80 mg/dL (ref 70–99)
Potassium: 4.2 mmol/L (ref 3.5–5.2)
Sodium: 139 mmol/L (ref 134–144)
Total Protein: 7.4 g/dL (ref 6.0–8.5)
eGFR: 117 mL/min/{1.73_m2} (ref >59)

## 2023-09-26 LAB — CBC WITH DIFFERENTIAL/PLATELET
Basophils Absolute: 0.1 10*3/uL (ref 0.0–0.2)
Basos: 1 %
EOS (ABSOLUTE): 0.2 10*3/uL (ref 0.0–0.4)
Eos: 2 %
Hematocrit: 45.2 % (ref 37.5–51.0)
Hemoglobin: 15.1 g/dL (ref 13.0–17.7)
Immature Grans (Abs): 0 10*3/uL (ref 0.0–0.1)
Immature Granulocytes: 0 %
Lymphocytes Absolute: 3.2 10*3/uL — ABNORMAL HIGH (ref 0.7–3.1)
Lymphs: 33 %
MCH: 29.3 pg (ref 26.6–33.0)
MCHC: 33.4 g/dL (ref 31.5–35.7)
MCV: 88 fL (ref 79–97)
Monocytes Absolute: 0.6 10*3/uL (ref 0.1–0.9)
Monocytes: 7 %
Neutrophils Absolute: 5.6 10*3/uL (ref 1.4–7.0)
Neutrophils: 57 %
Platelets: 389 10*3/uL (ref 150–450)
RBC: 5.15 x10E6/uL (ref 4.14–5.80)
RDW: 13.1 % (ref 11.6–15.4)
WBC: 9.6 10*3/uL (ref 3.4–10.8)

## 2023-09-26 LAB — LIPID PANEL WITH LDL/HDL RATIO
Cholesterol, Total: 230 mg/dL — ABNORMAL HIGH (ref 100–199)
HDL: 49 mg/dL (ref >39)
LDL Chol Calc (NIH): 160 mg/dL — ABNORMAL HIGH (ref 0–99)
LDL/HDL Ratio: 3.3 ratio (ref 0.0–3.6)
Triglycerides: 119 mg/dL (ref 0–149)
VLDL Cholesterol Cal: 21 mg/dL (ref 5–40)

## 2023-09-26 LAB — TSH+FREE T4
Free T4: 0.99 ng/dL (ref 0.82–1.77)
TSH: 1.41 u[IU]/mL (ref 0.450–4.500)

## 2023-09-30 ENCOUNTER — Encounter: Payer: Self-pay | Admitting: Physician Assistant

## 2023-09-30 ENCOUNTER — Ambulatory Visit (INDEPENDENT_AMBULATORY_CARE_PROVIDER_SITE_OTHER): Admitting: Physician Assistant

## 2023-09-30 VITALS — BP 130/80 | HR 81 | Temp 98.5°F | Resp 16 | Ht 78.0 in | Wt 312.8 lb

## 2023-09-30 DIAGNOSIS — F909 Attention-deficit hyperactivity disorder, unspecified type: Secondary | ICD-10-CM

## 2023-09-30 DIAGNOSIS — Z0001 Encounter for general adult medical examination with abnormal findings: Secondary | ICD-10-CM | POA: Diagnosis not present

## 2023-09-30 DIAGNOSIS — F411 Generalized anxiety disorder: Secondary | ICD-10-CM | POA: Diagnosis not present

## 2023-09-30 DIAGNOSIS — G43111 Migraine with aura, intractable, with status migrainosus: Secondary | ICD-10-CM | POA: Diagnosis not present

## 2023-09-30 DIAGNOSIS — E669 Obesity, unspecified: Secondary | ICD-10-CM

## 2023-09-30 DIAGNOSIS — E782 Mixed hyperlipidemia: Secondary | ICD-10-CM

## 2023-09-30 MED ORDER — AMPHETAMINE-DEXTROAMPHET ER 20 MG PO CP24: 20.0000 mg | ORAL_CAPSULE | Freq: Every day | ORAL | 0 refills | Status: DC

## 2023-09-30 MED ORDER — AMPHETAMINE-DEXTROAMPHETAMINE 5 MG PO TABS: 5.0000 mg | ORAL_TABLET | Freq: Every day | ORAL | 0 refills | Status: DC

## 2023-09-30 NOTE — Progress Notes (Signed)
 [facility] [address] [place], [place] [address]  Internal MEDICINE  Office Visit Note  Patient Name: Ethan Gomez  [id number]  [id number]  Date of Service: 10/18/2023  Chief Complaint  Patient presents with   Annual Exam   Gastroesophageal Reflux     HPI Pt is here for routine health maintenance examination -Doing well with adderall dosing, helping focus without side effects -migraines doing well -did try zavspret but couldn't stand the taste after. Prefers to stick with nurtec for acute tx -still doing well with topamax  and effexor  helping too. Likes current dosing -labs reviewed--Cholesterol improving, continuing to work on this. TG came down significantly but LDL still high and will work on this -lymphocyte slightly elevated, but also was bring treated for sinus infection -weight loss stalled some but clothes fitting better, has been gaining muscle.   Current Medication: Outpatient Encounter Medications as of 09/30/2023  Medication Sig   cetirizine (ZYRTEC) 10 MG tablet Take 10 mg by mouth at bedtime.    gabapentin  (NEURONTIN ) 100 MG capsule Take 1 capsule (100 mg total) by mouth 3 (three) times daily.   meloxicam (MOBIC) 15 MG tablet Take 15 mg by mouth daily.   Multiple Vitamin (MULTIVITAMIN) tablet Take 1 tablet by mouth daily.   NURTEC 75 MG TBDP TAKE ONE TABLET BY MOUTH DAILY AS NEEDED FOR MIGRAINE   venlafaxine  XR (EFFEXOR -XR) 75 MG 24 hr capsule TAKE 1 CAPSULE BY MOUTH DAILY WITH BREAKFAST.   [DISCONTINUED] amphetamine -dextroamphetamine  (ADDERALL XR) 20 MG 24 hr capsule Take 1 capsule (20 mg total) by mouth daily.   [DISCONTINUED] amphetamine -dextroamphetamine  (ADDERALL) 5 MG tablet Take 1 tablet (5 mg total) by mouth daily.   [DISCONTINUED] methocarbamol  (ROBAXIN ) 750 MG tablet TAKE 1 TABLET (750 MG TOTAL) BY MOUTH DAILY AS NEEDED FOR MUSCLE SPASMS   [DISCONTINUED] topiramate  (TOPAMAX ) 50 MG tablet Take 1 tablet (50 mg total) by mouth 2 (two) times  daily.   amphetamine -dextroamphetamine  (ADDERALL XR) 20 MG 24 hr capsule Take 1 capsule (20 mg total) by mouth daily.   amphetamine -dextroamphetamine  (ADDERALL) 5 MG tablet Take 1 tablet (5 mg total) by mouth daily.   No facility-administered encounter medications on file as of 09/30/2023.    Surgical History: Past Surgical History:  Procedure Laterality Date   BONE EXOSTOSIS EXCISION Left 01/27/2015   Procedure: EXCISION LEFT HEEL OS CALCANEAL SPUR ;  Surgeon: Ferd Householder, MD;  Location: [facility];  Service: Orthopedics;  Laterality: Left;  ANESTHESIA: GENERAL, BLOCK   left ankle surgery     TENDON REPAIR Left 01/27/2015   Procedure: REPAIR LEFT PERONEUS LONGUS TENDON;  Surgeon: Ferd Householder, MD;  Location: [facility];  Service: Orthopedics;  Laterality: Left;   VASECTOMY N/A 11/20/2019   Procedure: VASECTOMY;  Surgeon: Lawerence Pressman, MD;  Location: [facility];  Service: Urology;  Laterality: N/A;    Medical History: Past Medical History:  Diagnosis Date   Acid reflux    OCC-TUMS PRN   ADHD (attention deficit hyperactivity disorder)    Calcaneal spur of left foot 12/2014   Family history of adverse reaction to anesthesia    pt's mother has hx. of post-op N/V   Migraines    MIGRAINES   Rupture of tendon 12/2014   left ankle and foot    Family History: Family History  Problem Relation Age of Onset   Anesthesia problems Mother        post-op N/V   Arthritis Mother  Hyperlipidemia Mother    Hypertension Mother    Arthritis Other        all 4 of his grandparents   Hyperlipidemia Other        all grandparents   Hypertension Maternal Grandmother    Hypertension Paternal Grandfather    Diabetes Maternal Grandfather       Review of Systems  Constitutional:  Negative for fever.  HENT:  Negative for congestion, mouth sores and postnasal drip.   Eyes:  Negative for visual disturbance.  Respiratory:  Negative for cough.   Cardiovascular:   Negative for chest pain.  Gastrointestinal:  Negative for anal bleeding.  Genitourinary:  Negative for flank pain.  Musculoskeletal:  Positive for back pain.  Skin:  Negative for rash.  Neurological:  Positive for headaches. Negative for syncope.  Psychiatric/Behavioral:  Negative for dysphoric mood. The patient is not nervous/anxious.      Vital Signs: BP 130/80   Pulse 81   Temp 98.5 F (36.9 C)   Resp 16   Ht 6\' 6"  (1.981 m)   Wt (!) 312 lb 12.8 oz (141.9 kg)   SpO2 98%   BMI 36.15 kg/m    Physical Exam Vitals and nursing note reviewed.  Constitutional:      General: He is not in acute distress.    Appearance: Normal appearance. He is well-developed. He is obese. He is not diaphoretic.  HENT:     Head: Normocephalic and atraumatic.     Mouth/Throat:     Pharynx: No oropharyngeal exudate.  Eyes:     Pupils: Pupils are equal, round, and reactive to light.  Neck:     Thyroid: No thyromegaly.     Vascular: No JVD.     Trachea: No tracheal deviation.  Cardiovascular:     Rate and Rhythm: Normal rate and regular rhythm.     Heart sounds: Normal heart sounds. No murmur heard.    No friction rub. No gallop.  Pulmonary:     Effort: Pulmonary effort is normal.  Abdominal:     General: Bowel sounds are normal.     Tenderness: There is no abdominal tenderness.  Musculoskeletal:        General: Normal range of motion.     Cervical back: Normal range of motion and neck supple.  Lymphadenopathy:     Cervical: No cervical adenopathy.  Skin:    General: Skin is warm and dry.  Neurological:     Mental Status: He is alert and oriented to person, place, and time.  Psychiatric:        Behavior: Behavior normal.        Thought Content: Thought content normal.        Judgment: Judgment normal.      LABS: Recent Results (from the past 2160 hours)  TSH + free T4     Status: None   Collection Time: 09/25/23  3:40 PM  Result Value Ref Range   TSH 1.410 0.450 - 4.500  uIU/mL   Free T4 0.99 0.82 - 1.77 ng/dL  Lipid Panel With LDL/HDL Ratio     Status: Abnormal   Collection Time: 09/25/23  3:40 PM  Result Value Ref Range   Cholesterol, Total 230 (H) 100 - 199 mg/dL   Triglycerides 914 0 - 149 mg/dL   HDL 49 >78 mg/dL   VLDL Cholesterol Cal 21 5 - 40 mg/dL   LDL Chol Calc (NIH) 295 (H) 0 - 99 mg/dL   LDL/HDL Ratio 3.3  0.0 - 3.6 ratio    Comment:                                     LDL/HDL Ratio                                             Men  Women                               1/2 Avg.Risk  1.0    1.5                                   Avg.Risk  3.6    3.2                                2X Avg.Risk  6.2    5.0                                3X Avg.Risk  8.0    6.1   CBC w/Diff/Platelet     Status: Abnormal   Collection Time: 09/25/23  3:40 PM  Result Value Ref Range   WBC 9.6 3.4 - 10.8 x10E3/uL   RBC 5.15 4.14 - 5.80 x10E6/uL   Hemoglobin 15.1 13.0 - 17.7 g/dL   Hematocrit 16.1 09.6 - 51.0 %   MCV 88 79 - 97 fL   MCH 29.3 26.6 - 33.0 pg   MCHC 33.4 31.5 - 35.7 g/dL   RDW 04.5 40.9 - 81.1 %   Platelets 389 150 - 450 x10E3/uL   Neutrophils 57 Not Estab. %   Lymphs 33 Not Estab. %   Monocytes 7 Not Estab. %   Eos 2 Not Estab. %   Basos 1 Not Estab. %   Neutrophils Absolute 5.6 1.4 - 7.0 x10E3/uL   Lymphocytes Absolute 3.2 (H) 0.7 - 3.1 x10E3/uL   Monocytes Absolute 0.6 0.1 - 0.9 x10E3/uL   EOS (ABSOLUTE) 0.2 0.0 - 0.4 x10E3/uL   Basophils Absolute 0.1 0.0 - 0.2 x10E3/uL   Immature Granulocytes 0 Not Estab. %   Immature Grans (Abs) 0.0 0.0 - 0.1 x10E3/uL  Comprehensive metabolic panel     Status: None   Collection Time: 09/25/23  3:40 PM  Result Value Ref Range   Glucose 80 70 - 99 mg/dL   BUN 15 6 - 20 mg/dL   Creatinine, Ser 9.14 0.76 - 1.27 mg/dL   eGFR 782 >95 AO/ZHY/8.65   BUN/Creatinine Ratio 18 9 - 20   Sodium 139 134 - 144 mmol/L   Potassium 4.2 3.5 - 5.2 mmol/L   Chloride 101 96 - 106 mmol/L   CO2 22 20 - 29 mmol/L    Calcium 9.9 8.7 - 10.2 mg/dL   Total Protein 7.4 6.0 - 8.5 g/dL   Albumin 4.5 4.1 - 5.1 g/dL   Globulin, Total 2.9 1.5 - 4.5 g/dL   Bilirubin Total 0.3 0.0 - 1.2 mg/dL   Alkaline Phosphatase 83 44 - 121 IU/L   AST 19 0 - 40 IU/L  ALT 19 0 - 44 IU/L        Assessment/Plan: 1. Encounter for general adult medical examination with abnormal findings (Primary) CPE performed, labs reviewed  2. Attention deficit hyperactivity disorder (ADHD), unspecified ADHD type Doing well with Adderall and may continue as before.  Future refill sent - amphetamine -dextroamphetamine  (ADDERALL) 5 MG tablet; Take 1 tablet (5 mg total) by mouth daily.  Dispense: 30 tablet; Refill: 0 - amphetamine -dextroamphetamine  (ADDERALL XR) 20 MG 24 hr capsule; Take 1 capsule (20 mg total) by mouth daily.  Dispense: 30 capsule; Refill: 0 [place] Controlled Substance Database was reviewed by me for overdose risk score (ORS) Refilled Controlled medications today. Reviewed risks and possible side effects associated with taking Stimulants. Combination of these drugs with other psychotropic medications could cause dizziness and drowsiness. Pt needs to Monitor symptoms and exercise caution in driving and operating heavy machinery to avoid damages to oneself, to others and to the surroundings. Patient verbalized understanding in this matter. Dependence and abuse for these drugs will be monitored closely. A Controlled substance policy and procedure is on file which allows [facility] to order a urine drug screen test at any visit. Patient understands and agrees with the plan..  3. Intractable migraine with aura with status migrainosus Stable, continue current medications  4. GAD (generalized anxiety disorder) Improved, continue Effexor   5. Mixed hyperlipidemia Improving, continue to work on diet and exercise  6. Obesity (BMI 30-39.9) Has been working towards weight loss goals.  Reports this has plateaued recently but is  continuing to exercise regularly and has been gaining muscle mass.  Reports clothes fitting better and will continue to work towards this   General Counseling: Perry verbalizes understanding of the findings of todays visit and agrees with plan of treatment. I have discussed any further diagnostic evaluation that may be needed or ordered today. We also reviewed his medications today. he has been encouraged to call the office with any questions or concerns that should arise related to todays visit.    Counseling:    No orders of the defined types were placed in this encounter.   Meds ordered this encounter  Medications   amphetamine -dextroamphetamine  (ADDERALL) 5 MG tablet    Sig: Take 1 tablet (5 mg total) by mouth daily.    Dispense:  30 tablet    Refill:  0   amphetamine -dextroamphetamine  (ADDERALL XR) 20 MG 24 hr capsule    Sig: Take 1 capsule (20 mg total) by mouth daily.    Dispense:  30 capsule    Refill:  0    This patient was seen by Taylor Favia, PA-C in collaboration with Dr. Verneta Gone as a part of collaborative care agreement.  Total time spent:35 Minutes  Time spent includes review of chart, medications, test results, and follow up plan with the patient.     Lawton Price, MD  Internal Medicine

## 2023-10-05 ENCOUNTER — Other Ambulatory Visit: Payer: Self-pay | Admitting: Physician Assistant

## 2023-10-05 DIAGNOSIS — G43111 Migraine with aura, intractable, with status migrainosus: Secondary | ICD-10-CM

## 2023-10-16 ENCOUNTER — Other Ambulatory Visit: Payer: Self-pay | Admitting: Physician Assistant

## 2023-10-16 DIAGNOSIS — G43111 Migraine with aura, intractable, with status migrainosus: Secondary | ICD-10-CM

## 2023-11-19 ENCOUNTER — Telehealth: Payer: Self-pay

## 2023-11-19 ENCOUNTER — Other Ambulatory Visit: Payer: Self-pay | Admitting: Physician Assistant

## 2023-11-19 DIAGNOSIS — F909 Attention-deficit hyperactivity disorder, unspecified type: Secondary | ICD-10-CM

## 2023-11-19 MED ORDER — AMPHETAMINE-DEXTROAMPHETAMINE 5 MG PO TABS: 5.0000 mg | ORAL_TABLET | Freq: Every day | ORAL | 0 refills | Status: DC

## 2023-11-19 MED ORDER — AMPHETAMINE-DEXTROAMPHET ER 20 MG PO CP24: 20.0000 mg | ORAL_CAPSULE | Freq: Every day | ORAL | 0 refills | Status: DC

## 2023-11-20 NOTE — Telephone Encounter (Signed)
Pt notified that we send  

## 2023-11-28 DIAGNOSIS — M9903 Segmental and somatic dysfunction of lumbar region: Secondary | ICD-10-CM | POA: Diagnosis not present

## 2023-11-28 DIAGNOSIS — R519 Headache, unspecified: Secondary | ICD-10-CM | POA: Diagnosis not present

## 2023-11-28 DIAGNOSIS — M9901 Segmental and somatic dysfunction of cervical region: Secondary | ICD-10-CM | POA: Diagnosis not present

## 2023-11-28 DIAGNOSIS — M62830 Muscle spasm of back: Secondary | ICD-10-CM | POA: Diagnosis not present

## 2023-12-15 ENCOUNTER — Other Ambulatory Visit: Payer: Self-pay | Admitting: Physician Assistant

## 2023-12-15 DIAGNOSIS — G43111 Migraine with aura, intractable, with status migrainosus: Secondary | ICD-10-CM

## 2023-12-19 ENCOUNTER — Telehealth: Payer: Self-pay

## 2023-12-19 ENCOUNTER — Other Ambulatory Visit: Payer: Self-pay | Admitting: Physician Assistant

## 2023-12-19 DIAGNOSIS — F909 Attention-deficit hyperactivity disorder, unspecified type: Secondary | ICD-10-CM

## 2023-12-19 MED ORDER — AMPHETAMINE-DEXTROAMPHETAMINE 5 MG PO TABS: 5.0000 mg | ORAL_TABLET | Freq: Every day | ORAL | 0 refills | Status: DC

## 2023-12-19 MED ORDER — AMPHETAMINE-DEXTROAMPHET ER 20 MG PO CP24: 20.0000 mg | ORAL_CAPSULE | Freq: Every day | ORAL | 0 refills | Status: DC

## 2023-12-19 NOTE — Telephone Encounter (Signed)
 Try to call pt voicemail full that we sent med

## 2023-12-26 ENCOUNTER — Ambulatory Visit: Admitting: Physician Assistant

## 2023-12-26 ENCOUNTER — Encounter: Payer: Self-pay | Admitting: Physician Assistant

## 2023-12-26 VITALS — BP 130/84 | HR 92 | Temp 98.0°F | Resp 16 | Ht 78.0 in | Wt 303.0 lb

## 2023-12-26 DIAGNOSIS — G43111 Migraine with aura, intractable, with status migrainosus: Secondary | ICD-10-CM | POA: Diagnosis not present

## 2023-12-26 DIAGNOSIS — E669 Obesity, unspecified: Secondary | ICD-10-CM

## 2023-12-26 DIAGNOSIS — F909 Attention-deficit hyperactivity disorder, unspecified type: Secondary | ICD-10-CM | POA: Diagnosis not present

## 2023-12-26 MED ORDER — NURTEC 75 MG PO TBDP: ORAL_TABLET | ORAL | 2 refills | Status: DC

## 2023-12-26 MED ORDER — AMPHETAMINE-DEXTROAMPHET ER 20 MG PO CP24: 20.0000 mg | ORAL_CAPSULE | Freq: Every day | ORAL | 0 refills | Status: DC

## 2023-12-26 MED ORDER — AMPHETAMINE-DEXTROAMPHETAMINE 5 MG PO TABS: 5.0000 mg | ORAL_TABLET | Freq: Every day | ORAL | 0 refills | Status: DC

## 2023-12-26 NOTE — Progress Notes (Signed)
 [facility] [address] [place], [place] [address]  Internal MEDICINE  Office Visit Note  Patient Name: Ethan Gomez  [id number]  [id number]  Date of Service: 12/26/2023  Chief Complaint  Patient presents with   Follow-up   Gastroesophageal Reflux    HPI Pt is here for routine follow up -effexor  helping and doing well at current dose -migraine intensity decreased -will try nurtec preventive dosing -down 9lbs since last visit -has been continuing to exercise, cardio and strength combination -school starts back in Aug, tutoring in the meantime -doing well with adderall dosing, no S/E  Current Medication: Outpatient Encounter Medications as of 12/26/2023  Medication Sig   cetirizine (ZYRTEC) 10 MG tablet Take 10 mg by mouth at bedtime.    gabapentin  (NEURONTIN ) 100 MG capsule Take 1 capsule (100 mg total) by mouth 3 (three) times daily.   meloxicam (MOBIC) 15 MG tablet Take 15 mg by mouth daily.   methocarbamol  (ROBAXIN ) 750 MG tablet TAKE 1 TABLET (750 MG TOTAL) BY MOUTH DAILY AS NEEDED FOR MUSCLE SPASMS   Multiple Vitamin (MULTIVITAMIN) tablet Take 1 tablet by mouth daily.   Rimegepant Sulfate (NURTEC) 75 MG TBDP Take 1 tablet by mouth every other day for migraine prevention. No more than one dose in 24 hours.   topiramate  (TOPAMAX ) 50 MG tablet TAKE 1 TABLET BY MOUTH TWICE A DAY   venlafaxine  XR (EFFEXOR -XR) 75 MG 24 hr capsule TAKE 1 CAPSULE BY MOUTH DAILY WITH BREAKFAST.   [DISCONTINUED] amphetamine -dextroamphetamine  (ADDERALL XR) 20 MG 24 hr capsule Take 1 capsule (20 mg total) by mouth daily.   [DISCONTINUED] amphetamine -dextroamphetamine  (ADDERALL) 5 MG tablet Take 1 tablet (5 mg total) by mouth daily.   [DISCONTINUED] NURTEC 75 MG TBDP TAKE ONE TABLET BY MOUTH DAILY AS NEEDED FOR MIGRAINE   [START ON 01/16/2024] amphetamine -dextroamphetamine  (ADDERALL XR) 20 MG 24 hr capsule Take 1 capsule (20 mg total) by mouth daily.   [START ON 01/16/2024]  amphetamine -dextroamphetamine  (ADDERALL) 5 MG tablet Take 1 tablet (5 mg total) by mouth daily.   No facility-administered encounter medications on file as of 12/26/2023.    Surgical History: Past Surgical History:  Procedure Laterality Date   BONE EXOSTOSIS EXCISION Left 01/27/2015   Procedure: EXCISION LEFT HEEL OS CALCANEAL SPUR ;  Surgeon: Ethan JULIANNA Chancy, MD;  Location: [facility];  Service: Orthopedics;  Laterality: Left;  ANESTHESIA: GENERAL, BLOCK   left ankle surgery     TENDON REPAIR Left 01/27/2015   Procedure: REPAIR LEFT PERONEUS LONGUS TENDON;  Surgeon: Ethan JULIANNA Chancy, MD;  Location: [facility];  Service: Orthopedics;  Laterality: Left;   VASECTOMY N/A 11/20/2019   Procedure: VASECTOMY;  Surgeon: Ethan Redell BROCKS, MD;  Location: [facility];  Service: Urology;  Laterality: N/A;    Medical History: Past Medical History:  Diagnosis Date   Acid reflux    OCC-TUMS PRN   ADHD (attention deficit hyperactivity disorder)    Calcaneal spur of left foot 12/2014   Family history of adverse reaction to anesthesia    pt's mother has hx. of post-op N/V   Migraines    MIGRAINES   Rupture of tendon 12/2014   left ankle and foot    Family History: Family History  Problem Relation Age of Onset   Anesthesia problems Mother        post-op N/V   Arthritis Mother    Hyperlipidemia Mother    Hypertension Mother    Arthritis Other  all 4 of his grandparents   Hyperlipidemia Other        all grandparents   Hypertension Maternal Grandmother    Hypertension Paternal Grandfather    Diabetes Maternal Grandfather     Social History   Socioeconomic History   Marital status: Married    Spouse name: Not on file   Number of children: Not on file   Years of education: Not on file   Highest education level: Not on file  Occupational History   Not on file  Tobacco Use   Smoking status: Never   Smokeless tobacco: Former   Tobacco comments:    no  smokeless tobacco since age 48  Vaping Use   Vaping status: Never Used  Substance and Sexual Activity   Alcohol use: Yes    Alcohol/week: 0.0 standard drinks of alcohol    Comment: rare   Drug use: No   Sexual activity: Yes  Other Topics Concern   Not on file  Social History Narrative   Not on file   Social Drivers of Health   Financial Resource Strain: Not on file  Food Insecurity: Not on file  Transportation Needs: Not on file  Physical Activity: Not on file  Stress: Not on file  Social Connections: Not on file  Intimate Partner Violence: Not on file      Review of Systems  Constitutional:  Negative for fever.  HENT:  Negative for congestion, mouth sores and postnasal drip.   Eyes:  Negative for visual disturbance.  Respiratory:  Negative for cough.   Cardiovascular:  Negative for chest pain.  Gastrointestinal:  Negative for anal bleeding.  Genitourinary:  Negative for flank pain.  Skin:  Negative for rash.  Neurological:  Positive for headaches. Negative for syncope.  Psychiatric/Behavioral:  Negative for dysphoric mood. The patient is not nervous/anxious.     Vital Signs: BP 130/84   Pulse 92   Temp 98 F (36.7 C)   Resp 16   Ht 6' 6 (1.981 m)   Wt (!) 303 lb (137.4 kg)   SpO2 98%   BMI 35.02 kg/m    Physical Exam Vitals and nursing note reviewed.  Constitutional:      General: He is not in acute distress.    Appearance: Normal appearance. He is well-developed. He is obese. He is not diaphoretic.  HENT:     Head: Normocephalic and atraumatic.  Eyes:     Extraocular Movements: Extraocular movements intact.  Neck:     Thyroid: No thyromegaly.     Vascular: No JVD.     Trachea: No tracheal deviation.  Cardiovascular:     Rate and Rhythm: Normal rate and regular rhythm.     Heart sounds: Normal heart sounds. No murmur heard.    No friction rub. No gallop.  Pulmonary:     Effort: Pulmonary effort is normal.  Musculoskeletal:        General:  Normal range of motion.  Skin:    General: Skin is warm and dry.  Neurological:     Mental Status: He is alert and oriented to person, place, and time.  Psychiatric:        Behavior: Behavior normal.        Thought Content: Thought content normal.        Judgment: Judgment normal.        Assessment/Plan: 1. Attention deficit hyperactivity disorder (ADHD), unspecified ADHD type (Primary) May continue adderall as before, future refill sent - amphetamine -dextroamphetamine  (ADDERALL)  5 MG tablet; Take 1 tablet (5 mg total) by mouth daily.  Dispense: 30 tablet; Refill: 0 - amphetamine -dextroamphetamine  (ADDERALL XR) 20 MG 24 hr capsule; Take 1 capsule (20 mg total) by mouth daily.  Dispense: 30 capsule; Refill: 0  2. Intractable migraine with aura with status migrainosus Will try switching to preventive dosing with nurtec - Rimegepant Sulfate (NURTEC) 75 MG TBDP; Take 1 tablet by mouth every other day for migraine prevention. No more than one dose in 24 hours.  Dispense: 16 tablet; Refill: 2  3. Obesity (BMI 30-39.9) Down another 9lbs since last visit and is continuing to work on diet and exercise toward weight loss goals   General Counseling: Ethan Gomez verbalizes understanding of the findings of todays visit and agrees with plan of treatment. I have discussed any further diagnostic evaluation that may be needed or ordered today. We also reviewed his medications today. he has been encouraged to call the office with any questions or concerns that should arise related to todays visit.    No orders of the defined types were placed in this encounter.   Meds ordered this encounter  Medications   amphetamine -dextroamphetamine  (ADDERALL) 5 MG tablet    Sig: Take 1 tablet (5 mg total) by mouth daily.    Dispense:  30 tablet    Refill:  0   amphetamine -dextroamphetamine  (ADDERALL XR) 20 MG 24 hr capsule    Sig: Take 1 capsule (20 mg total) by mouth daily.    Dispense:  30 capsule     Refill:  0   Rimegepant Sulfate (NURTEC) 75 MG TBDP    Sig: Take 1 tablet by mouth every other day for migraine prevention. No more than one dose in 24 hours.    Dispense:  16 tablet    Refill:  2    This patient was seen by Ethan Pro, PA-C in collaboration with Dr. Sigrid Gomez as a part of collaborative care agreement.   Total time spent:30 Minutes Time spent includes review of chart, medications, test results, and follow up plan with the patient.      Dr Fozia M Khan Internal medicine

## 2024-02-21 ENCOUNTER — Telehealth: Payer: Self-pay

## 2024-02-21 ENCOUNTER — Other Ambulatory Visit: Payer: Self-pay | Admitting: Physician Assistant

## 2024-02-21 DIAGNOSIS — F909 Attention-deficit hyperactivity disorder, unspecified type: Secondary | ICD-10-CM

## 2024-02-21 MED ORDER — AMPHETAMINE-DEXTROAMPHET ER 20 MG PO CP24: 20.0000 mg | ORAL_CAPSULE | Freq: Every day | ORAL | 0 refills | Status: DC

## 2024-02-21 MED ORDER — AMPHETAMINE-DEXTROAMPHETAMINE 5 MG PO TABS: 5.0000 mg | ORAL_TABLET | Freq: Every day | ORAL | 0 refills | Status: DC

## 2024-02-21 NOTE — Telephone Encounter (Signed)
 Pt notified that  med sent

## 2024-03-15 ENCOUNTER — Other Ambulatory Visit: Payer: Self-pay | Admitting: Physician Assistant

## 2024-03-15 DIAGNOSIS — F411 Generalized anxiety disorder: Secondary | ICD-10-CM

## 2024-03-24 ENCOUNTER — Telehealth: Payer: Self-pay

## 2024-03-24 DIAGNOSIS — F909 Attention-deficit hyperactivity disorder, unspecified type: Secondary | ICD-10-CM

## 2024-03-25 MED ORDER — AMPHETAMINE-DEXTROAMPHET ER 20 MG PO CP24: 20.0000 mg | ORAL_CAPSULE | Freq: Every day | ORAL | 0 refills | Status: DC

## 2024-03-25 NOTE — Telephone Encounter (Signed)
 Pt notified that we sent 10 days

## 2024-04-02 ENCOUNTER — Ambulatory Visit: Admitting: Physician Assistant

## 2024-04-02 ENCOUNTER — Encounter: Payer: Self-pay | Admitting: Physician Assistant

## 2024-04-02 VITALS — BP 122/76 | HR 78 | Temp 98.0°F | Resp 16 | Ht 78.0 in | Wt 302.0 lb

## 2024-04-02 DIAGNOSIS — Z79899 Other long term (current) drug therapy: Secondary | ICD-10-CM

## 2024-04-02 DIAGNOSIS — F909 Attention-deficit hyperactivity disorder, unspecified type: Secondary | ICD-10-CM | POA: Diagnosis not present

## 2024-04-02 DIAGNOSIS — E669 Obesity, unspecified: Secondary | ICD-10-CM | POA: Diagnosis not present

## 2024-04-02 DIAGNOSIS — G43111 Migraine with aura, intractable, with status migrainosus: Secondary | ICD-10-CM | POA: Diagnosis not present

## 2024-04-02 DIAGNOSIS — F411 Generalized anxiety disorder: Secondary | ICD-10-CM | POA: Diagnosis not present

## 2024-04-02 LAB — POCT URINE DRUG SCREEN
Methylenedioxyamphetamine: NOT DETECTED
POC Amphetamine UR: POSITIVE — AB
POC BENZODIAZEPINES UR: NOT DETECTED
POC Barbiturate UR: NOT DETECTED
POC Cocaine UR: NOT DETECTED
POC Ecstasy UR: NOT DETECTED
POC Marijuana UR: NOT DETECTED
POC Methadone UR: NOT DETECTED
POC Methamphetamine UR: NOT DETECTED
POC Opiate Ur: NOT DETECTED
POC Oxycodone UR: NOT DETECTED
POC PHENCYCLIDINE UR: NOT DETECTED
POC TRICYCLICS UR: NOT DETECTED

## 2024-04-02 MED ORDER — AMPHETAMINE-DEXTROAMPHET ER 20 MG PO CP24: 20.0000 mg | ORAL_CAPSULE | ORAL | 0 refills | Status: DC

## 2024-04-02 MED ORDER — METHOCARBAMOL 750 MG PO TABS: 750.0000 mg | ORAL_TABLET | Freq: Every day | ORAL | 3 refills | Status: DC | PRN

## 2024-04-02 MED ORDER — TOPIRAMATE 50 MG PO TABS: 50.0000 mg | ORAL_TABLET | Freq: Two times a day (BID) | ORAL | 5 refills | Status: AC

## 2024-04-02 MED ORDER — AMPHETAMINE-DEXTROAMPHETAMINE 5 MG PO TABS
5.0000 mg | ORAL_TABLET | Freq: Every day | ORAL | 0 refills | Status: AC
Start: 2024-05-31 — End: ?

## 2024-04-02 MED ORDER — NURTEC 75 MG PO TBDP: ORAL_TABLET | ORAL | 2 refills | Status: AC

## 2024-04-02 MED ORDER — AMPHETAMINE-DEXTROAMPHET ER 20 MG PO CP24: 20.0000 mg | ORAL_CAPSULE | Freq: Every day | ORAL | 0 refills | Status: DC

## 2024-04-02 MED ORDER — AMPHETAMINE-DEXTROAMPHETAMINE 5 MG PO TABS: 5.0000 mg | ORAL_TABLET | Freq: Every day | ORAL | 0 refills | Status: DC

## 2024-04-02 NOTE — Progress Notes (Signed)
 [facility] [address] [place], [place] [address]  Internal MEDICINE  Office Visit Note  Patient Name: Ethan Gomez  [id number]  [id number]  Date of Service: 04/02/2024  Chief Complaint  Patient presents with   Follow-up   Gastroesophageal Reflux    HPI Pt is here for routine follow up -Little more stressed with new class of students not wanting to follow directions or do their homework. 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 -Still happy with effexor  dosing as is -Continuing to work on weight loss has slowed down but also building muscle and clothes still fitting better -Doing well with nurtec prevention dosing -Doing well with adderall dosing and will send 3months of refills -BP stable -right ankle/foot bothering him more, he had the same problem on the left previously and had surgery for it and was told he may need the same on the right in future but is putting off as long as possible  Current Medication: Outpatient Encounter Medications as of 04/02/2024  Medication Sig   [START ON 05/02/2024] amphetamine -dextroamphetamine  (ADDERALL XR) 20 MG 24 hr capsule Take 1 capsule (20 mg total) by mouth every morning.   [START ON 05/31/2024] amphetamine -dextroamphetamine  (ADDERALL XR) 20 MG 24 hr capsule Take 1 capsule (20 mg total) by mouth every morning.   [START ON 05/02/2024] amphetamine -dextroamphetamine  (ADDERALL) 5 MG tablet Take 1 tablet (5 mg total) by mouth daily.   [START ON 05/31/2024] amphetamine -dextroamphetamine  (ADDERALL) 5 MG tablet Take 1 tablet (5 mg total) by mouth daily.   cetirizine (ZYRTEC) 10 MG tablet Take 10 mg by mouth at bedtime.    Multiple Vitamin (MULTIVITAMIN) tablet Take 1 tablet by mouth daily.   venlafaxine  XR (EFFEXOR -XR) 75 MG 24 hr capsule TAKE 1 CAPSULE BY MOUTH DAILY WITH BREAKFAST.   [DISCONTINUED] amphetamine -dextroamphetamine  (ADDERALL XR) 20 MG 24 hr capsule Take 1 capsule (20 mg total) by mouth daily.   [DISCONTINUED]  amphetamine -dextroamphetamine  (ADDERALL) 5 MG tablet Take 1 tablet (5 mg total) by mouth daily.   [DISCONTINUED] gabapentin  (NEURONTIN ) 100 MG capsule Take 1 capsule (100 mg total) by mouth 3 (three) times daily.   [DISCONTINUED] meloxicam (MOBIC) 15 MG tablet Take 15 mg by mouth daily.   [DISCONTINUED] methocarbamol  (ROBAXIN ) 750 MG tablet TAKE 1 TABLET (750 MG TOTAL) BY MOUTH DAILY AS NEEDED FOR MUSCLE SPASMS   [DISCONTINUED] Rimegepant Sulfate (NURTEC) 75 MG TBDP Take 1 tablet by mouth every other day for migraine prevention. No more than one dose in 24 hours.   [DISCONTINUED] topiramate  (TOPAMAX ) 50 MG tablet TAKE 1 TABLET BY MOUTH TWICE A DAY   amphetamine -dextroamphetamine  (ADDERALL XR) 20 MG 24 hr capsule Take 1 capsule (20 mg total) by mouth daily.   amphetamine -dextroamphetamine  (ADDERALL) 5 MG tablet Take 1 tablet (5 mg total) by mouth daily.   methocarbamol  (ROBAXIN ) 750 MG tablet Take 1 tablet (750 mg total) by mouth daily as needed for muscle spasms.   Rimegepant Sulfate (NURTEC) 75 MG TBDP Take 1 tablet by mouth every other day for migraine prevention. No more than one dose in 24 hours.   topiramate  (TOPAMAX ) 50 MG tablet Take 1 tablet (50 mg total) by mouth 2 (two) times daily.   No facility-administered encounter medications on file as of 04/02/2024.    Surgical History: Past Surgical History:  Procedure Laterality Date   BONE EXOSTOSIS EXCISION Left 01/27/2015   Procedure: EXCISION LEFT HEEL OS CALCANEAL SPUR ;  Surgeon: Toribio JULIANNA Chancy, MD;  Location: [facility];  Service: Orthopedics;  Laterality: Left;  ANESTHESIA: GENERAL, BLOCK   left ankle surgery     TENDON REPAIR Left 01/27/2015   Procedure: REPAIR LEFT PERONEUS LONGUS TENDON;  Surgeon: Toribio JULIANNA Chancy, MD;  Location: [facility];  Service: Orthopedics;  Laterality: Left;   VASECTOMY N/A 11/20/2019   Procedure: VASECTOMY;  Surgeon: Francisca Redell BROCKS, MD;  Location: [facility];  Service: Urology;   Laterality: N/A;    Medical History: Past Medical History:  Diagnosis Date   Acid reflux    OCC-TUMS PRN   ADHD (attention deficit hyperactivity disorder)    Calcaneal spur of left foot 12/2014   Family history of adverse reaction to anesthesia    pt's mother has hx. of post-op N/V   Migraines    MIGRAINES   Rupture of tendon 12/2014   left ankle and foot    Family History: Family History  Problem Relation Age of Onset   Anesthesia problems Mother        post-op N/V   Arthritis Mother    Hyperlipidemia Mother    Hypertension Mother    Arthritis Other        all 4 of his grandparents   Hyperlipidemia Other        all grandparents   Hypertension Maternal Grandmother    Hypertension Paternal Grandfather    Diabetes Maternal Grandfather     Social History   Socioeconomic History   Marital status: Married    Spouse name: Not on file   Number of children: Not on file   Years of education: Not on file   Highest education level: Not on file  Occupational History   Not on file  Tobacco Use   Smoking status: Never   Smokeless tobacco: Former   Tobacco comments:    no smokeless tobacco since age 71  Vaping Use   Vaping status: Never Used  Substance and Sexual Activity   Alcohol use: Yes    Alcohol/week: 0.0 standard drinks of alcohol    Comment: rare   Drug use: No   Sexual activity: Yes  Other Topics Concern   Not on file  Social History Narrative   Not on file   Social Drivers of Health   Financial Resource Strain: Not on file  Food Insecurity: Not on file  Transportation Needs: Not on file  Physical Activity: Not on file  Stress: Not on file  Social Connections: Not on file  Intimate Partner Violence: Not on file      Review of Systems  Constitutional:  Negative for fever.  HENT:  Negative for congestion, mouth sores and postnasal drip.   Eyes:  Negative for visual disturbance.  Respiratory:  Negative for cough.   Cardiovascular:  Negative for  chest pain.  Gastrointestinal:  Negative for anal bleeding.  Genitourinary:  Negative for flank pain.  Musculoskeletal:  Positive for arthralgias.  Skin:  Negative for rash.  Neurological:  Positive for headaches. Negative for syncope.  Psychiatric/Behavioral:  Negative for dysphoric mood. The patient is not nervous/anxious.     Vital Signs: BP 122/76 Comment: 147/85  Pulse 78   Temp 98 F (36.7 C)   Resp 16   Ht 6' 6 (1.981 m)   Wt (!) 302 lb (137 kg)   SpO2 99%   BMI 34.90 kg/m    Physical Exam Vitals and nursing note reviewed.  Constitutional:      General: He is not in acute distress.    Appearance: Normal appearance. He is  well-developed. He is obese. He is not diaphoretic.  HENT:     Head: Normocephalic and atraumatic.  Eyes:     Extraocular Movements: Extraocular movements intact.  Neck:     Thyroid: No thyromegaly.     Vascular: No JVD.     Trachea: No tracheal deviation.  Cardiovascular:     Rate and Rhythm: Normal rate and regular rhythm.     Heart sounds: Normal heart sounds. No murmur heard.    No friction rub. No gallop.  Pulmonary:     Effort: Pulmonary effort is normal.  Musculoskeletal:        General: Normal range of motion.  Skin:    General: Skin is warm and dry.  Neurological:     Mental Status: He is alert and oriented to person, place, and time.  Psychiatric:        Behavior: Behavior normal.        Thought Content: Thought content normal.        Judgment: Judgment normal.        Assessment/Plan: 1. Intractable migraine with aura with status migrainosus (Primary) Stable, continue current medications - methocarbamol  (ROBAXIN ) 750 MG tablet; Take 1 tablet (750 mg total) by mouth daily as needed for muscle spasms.  Dispense: 30 tablet; Refill: 3 - Rimegepant Sulfate (NURTEC) 75 MG TBDP; Take 1 tablet by mouth every other day for migraine prevention. No more than one dose in 24 hours.  Dispense: 16 tablet; Refill: 2 - topiramate   (TOPAMAX ) 50 MG tablet; Take 1 tablet (50 mg total) by mouth 2 (two) times daily.  Dispense: 60 tablet; Refill: 5  2. Attention deficit hyperactivity disorder (ADHD), unspecified ADHD type Doing well, continue adderall as before. 3 months of refills sent - amphetamine -dextroamphetamine  (ADDERALL) 5 MG tablet; Take 1 tablet (5 mg total) by mouth daily.  Dispense: 30 tablet; Refill: 0 - amphetamine -dextroamphetamine  (ADDERALL XR) 20 MG 24 hr capsule; Take 1 capsule (20 mg total) by mouth daily.  Dispense: 30 capsule; Refill: 0 - amphetamine -dextroamphetamine  (ADDERALL) 5 MG tablet; Take 1 tablet (5 mg total) by mouth daily.  Dispense: 30 tablet; Refill: 0 - amphetamine -dextroamphetamine  (ADDERALL) 5 MG tablet; Take 1 tablet (5 mg total) by mouth daily.  Dispense: 30 tablet; Refill: 0 - amphetamine -dextroamphetamine  (ADDERALL XR) 20 MG 24 hr capsule; Take 1 capsule (20 mg total) by mouth every morning.  Dispense: 30 capsule; Refill: 0 - amphetamine -dextroamphetamine  (ADDERALL XR) 20 MG 24 hr capsule; Take 1 capsule (20 mg total) by mouth every morning.  Dispense: 30 capsule; Refill: 0 [place] Controlled Substance Database was reviewed by me for overdose risk score (ORS) Refilled Controlled medications today. Reviewed risks and possible side effects associated with taking Stimulants. Combination of these drugs with other psychotropic medications could cause dizziness and drowsiness. Pt needs to Monitor symptoms and exercise caution in driving and operating heavy machinery to avoid damages to oneself, to others and to the surroundings. Patient verbalized understanding in this matter. Dependence and abuse for these drugs will be monitored closely. A Controlled substance policy and procedure is on file which allows [facility] to order a urine drug screen test at any visit. Patient understands and agrees with the plan..  3. Encounter for long-term (current) use of high-risk medication - POCT Urine Drug  Screen  4. GAD (generalized anxiety disorder) Stable, continue effexor   5. Obesity (BMI 30-39.9) Continue to work on diet and exercise for weight loss goals   General Counseling: Boden verbalizes understanding of the findings  of todays visit and agrees with plan of treatment. I have discussed any further diagnostic evaluation that may be needed or ordered today. We also reviewed his medications today. he has been encouraged to call the office with any questions or concerns that should arise related to todays visit.    Orders Placed This Encounter  Procedures   POCT Urine Drug Screen    Meds ordered this encounter  Medications   amphetamine -dextroamphetamine  (ADDERALL) 5 MG tablet    Sig: Take 1 tablet (5 mg total) by mouth daily.    Dispense:  30 tablet    Refill:  0   amphetamine -dextroamphetamine  (ADDERALL XR) 20 MG 24 hr capsule    Sig: Take 1 capsule (20 mg total) by mouth daily.    Dispense:  30 capsule    Refill:  0   methocarbamol  (ROBAXIN ) 750 MG tablet    Sig: Take 1 tablet (750 mg total) by mouth daily as needed for muscle spasms.    Dispense:  30 tablet    Refill:  3   Rimegepant Sulfate (NURTEC) 75 MG TBDP    Sig: Take 1 tablet by mouth every other day for migraine prevention. No more than one dose in 24 hours.    Dispense:  16 tablet    Refill:  2   topiramate  (TOPAMAX ) 50 MG tablet    Sig: Take 1 tablet (50 mg total) by mouth 2 (two) times daily.    Dispense:  60 tablet    Refill:  5   amphetamine -dextroamphetamine  (ADDERALL) 5 MG tablet    Sig: Take 1 tablet (5 mg total) by mouth daily.    Dispense:  30 tablet    Refill:  0   amphetamine -dextroamphetamine  (ADDERALL) 5 MG tablet    Sig: Take 1 tablet (5 mg total) by mouth daily.    Dispense:  30 tablet    Refill:  0   amphetamine -dextroamphetamine  (ADDERALL XR) 20 MG 24 hr capsule    Sig: Take 1 capsule (20 mg total) by mouth every morning.    Dispense:  30 capsule    Refill:  0    amphetamine -dextroamphetamine  (ADDERALL XR) 20 MG 24 hr capsule    Sig: Take 1 capsule (20 mg total) by mouth every morning.    Dispense:  30 capsule    Refill:  0    This patient was seen by Tinnie Pro, PA-C in collaboration with Dr. Sigrid Bathe as a part of collaborative care agreement.   Total time spent:30 Minutes Time spent includes review of chart, medications, test results, and follow up plan with the patient.      Dr Fozia M Khan Internal medicine

## 2024-04-10 ENCOUNTER — Telehealth: Payer: Self-pay | Admitting: Physician Assistant

## 2024-04-10 DIAGNOSIS — J069 Acute upper respiratory infection, unspecified: Secondary | ICD-10-CM

## 2024-04-10 DIAGNOSIS — B9689 Other specified bacterial agents as the cause of diseases classified elsewhere: Secondary | ICD-10-CM

## 2024-04-10 MED ORDER — PSEUDOEPH-BROMPHEN-DM 30-2-10 MG/5ML PO SYRP: 5.0000 mL | ORAL_SOLUTION | Freq: Four times a day (QID) | ORAL | 0 refills | Status: DC | PRN

## 2024-04-10 MED ORDER — AMOXICILLIN-POT CLAVULANATE 875-125 MG PO TABS: 1.0000 | ORAL_TABLET | Freq: Two times a day (BID) | ORAL | 0 refills | Status: DC

## 2024-04-10 NOTE — Progress Notes (Signed)
 Virtual Visit Consent   Ethan Gomez, you are scheduled for a virtual visit with a [facility] provider today. Just as with appointments in the office, your consent must be obtained to participate. Your consent will be active for this visit and any virtual visit you may have with one of our providers in the next 365 days. If you have a MyChart account, a copy of this consent can be sent to you electronically.  As this is a virtual visit, video technology does not allow for your provider to perform a traditional examination. This may limit your provider's ability to fully assess your condition. If your provider identifies any concerns that need to be evaluated in person or the need to arrange testing (such as labs, EKG, etc.), we will make arrangements to do so. Although advances in technology are sophisticated, we cannot ensure that it will always work on either your end or our end. If the connection with a video visit is poor, the visit may have to be switched to a telephone visit. With either a video or telephone visit, we are not always able to ensure that we have a secure connection.  By engaging in this virtual visit, you consent to the provision of healthcare and authorize for your insurance to be billed (if applicable) for the services provided during this visit. Depending on your insurance coverage, you may receive a charge related to this service.  I need to obtain your verbal consent now. Are you willing to proceed with your visit today? Ethan Gomez has provided verbal consent on 04/10/2024 for a virtual visit (video or telephone). Ethan CHRISTELLA Dickinson, PA-C  Date: 04/10/2024 12:53 PM   Virtual Visit via Video Note   [name], connected with  Ethan Gomez  ([id number], Mar 02, 1986) on 04/10/24 at 12:45 PM EDT by a video-enabled telemedicine application and verified that I am speaking with the correct person using two identifiers.  Location: Patient: Virtual Visit Location  Patient: Other: work; isolated Provider: Engineer, mining Provider: Home Office   I discussed the limitations of evaluation and management by telemedicine and the availability of in person appointments. The patient expressed understanding and agreed to proceed.    History of Present Illness: Ethan Gomez is a 38 y.o. who identifies as a male who was assigned male at birth, and is being seen today for cough and congestion.  HPI: URI  This is a new problem. The current episode started in the past 7 days (Monday, 04/06/24). The problem has been gradually worsening. There has been no fever. Associated symptoms include congestion, coughing, ear pain (right), headaches, sinus pain (maxillary and frontal) and a sore throat. Pertinent negatives include no chest pain, diarrhea, nausea, plugged ear sensation, rhinorrhea, vomiting or wheezing. Associated symptoms comments: Hoarse voice, post nasal drainage, shortness of breath with coughing fit only. Treatments tried: sudafed, mucinex, tylenol  sinus, dayquil. The treatment provided no relief.     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 10/06/2015    Allergies:  Allergies  Allergen Reactions   Aspartame Anaphylaxis   Latex Shortness Of Breath, Itching and Rash   Sulfa Antibiotics Anaphylaxis   Buprenorphine Hcl Palpitations    fever   Morphine Palpitations    fever   Imitrex [Sumatriptan] Other (See Comments)    Severe nerve pain   Prednisone   Swelling    Oral prednisone    Medications:  Current Outpatient Medications:    amoxicillin -clavulanate (AUGMENTIN ) 875-125 MG tablet, Take 1 tablet by mouth 2 (two) times daily., Disp: 14 tablet, Rfl: 0   brompheniramine-pseudoephedrine-DM 30-2-10 MG/5ML syrup, Take 5 mLs by mouth 4 (four) times daily as needed., Disp: 120 mL, Rfl: 0    amphetamine -dextroamphetamine  (ADDERALL XR) 20 MG 24 hr capsule, Take 1 capsule (20 mg total) by mouth daily., Disp: 30 capsule, Rfl: 0   [START ON 05/02/2024] amphetamine -dextroamphetamine  (ADDERALL XR) 20 MG 24 hr capsule, Take 1 capsule (20 mg total) by mouth every morning., Disp: 30 capsule, Rfl: 0   [START ON 05/31/2024] amphetamine -dextroamphetamine  (ADDERALL XR) 20 MG 24 hr capsule, Take 1 capsule (20 mg total) by mouth every morning., Disp: 30 capsule, Rfl: 0   amphetamine -dextroamphetamine  (ADDERALL) 5 MG tablet, Take 1 tablet (5 mg total) by mouth daily., Disp: 30 tablet, Rfl: 0   [START ON 05/02/2024] amphetamine -dextroamphetamine  (ADDERALL) 5 MG tablet, Take 1 tablet (5 mg total) by mouth daily., Disp: 30 tablet, Rfl: 0   [START ON 05/31/2024] amphetamine -dextroamphetamine  (ADDERALL) 5 MG tablet, Take 1 tablet (5 mg total) by mouth daily., Disp: 30 tablet, Rfl: 0   cetirizine (ZYRTEC) 10 MG tablet, Take 10 mg by mouth at bedtime. , Disp: , Rfl:    methocarbamol  (ROBAXIN ) 750 MG tablet, Take 1 tablet (750 mg total) by mouth daily as needed for muscle spasms., Disp: 30 tablet, Rfl: 3   Multiple Vitamin (MULTIVITAMIN) tablet, Take 1 tablet by mouth daily., Disp: , Rfl:    Rimegepant Sulfate (NURTEC) 75 MG TBDP, Take 1 tablet by mouth every other day for migraine prevention. No more than one dose in 24 hours., Disp: 16 tablet, Rfl: 2   topiramate  (TOPAMAX ) 50 MG tablet, Take 1 tablet (50 mg total) by mouth 2 (two) times daily., Disp: 60 tablet, Rfl: 5   venlafaxine  XR (EFFEXOR -XR) 75 MG 24 hr capsule, TAKE 1 CAPSULE BY MOUTH DAILY WITH BREAKFAST., Disp: 30 capsule, Rfl: 5  Observations/Objective: Patient is well-developed, well-nourished in no acute distress.  Resting comfortably at home.  Head is normocephalic, atraumatic.  No labored breathing. Speech is clear and coherent with logical content.  Patient is alert and oriented at baseline.    Assessment and Plan: 1. Bacterial upper  respiratory infection (Primary) - amoxicillin -clavulanate (AUGMENTIN ) 875-125 MG tablet; Take 1 tablet by mouth 2 (two) times daily.  Dispense: 14 tablet; Refill: 0 - brompheniramine-pseudoephedrine-DM 30-2-10 MG/5ML syrup; Take 5 mLs by mouth 4 (four) times daily as needed.  Dispense: 120 mL; Refill: 0  - Worsening over a week despite OTC medications - Will treat with Augmentin  and Bromfed DM - Push fluids.  - Rest.  - Steam and humidifier can help - Seek in person evaluation if worsening or symptoms fail to improve    Follow Up Instructions: I discussed the assessment and treatment plan with the patient. The patient was provided an opportunity to ask questions and all were answered. The patient agreed with the plan and demonstrated an understanding of the instructions.  A copy of instructions were sent to the patient via MyChart unless otherwise noted below.    The patient was advised to call back or seek an in-person evaluation if the symptoms worsen or if the condition fails to improve as anticipated.    Ethan CHRISTELLA Dickinson, PA-C

## 2024-04-10 NOTE — Patient Instructions (Signed)
 Ethan Gomez, thank you for joining Delon CHRISTELLA Dickinson, PA-C for today's virtual visit.  While this provider is not your primary care provider (PCP), if your PCP is located in our provider database this encounter information will be shared with them immediately following your visit.   A [facility] MyChart account gives you access to today's visit and all your visits, tests, and labs performed at [facility]  click here if you don't have a [facility] MyChart account or go to [email or web address]  Consent: (Patient) Ethan Gomez provided verbal consent for this virtual visit at the beginning of the encounter.  Current Medications:  Current Outpatient Medications:    amoxicillin -clavulanate (AUGMENTIN ) 875-125 MG tablet, Take 1 tablet by mouth 2 (two) times daily., Disp: 14 tablet, Rfl: 0   brompheniramine-pseudoephedrine-DM 30-2-10 MG/5ML syrup, Take 5 mLs by mouth 4 (four) times daily as needed., Disp: 120 mL, Rfl: 0   amphetamine -dextroamphetamine  (ADDERALL XR) 20 MG 24 hr capsule, Take 1 capsule (20 mg total) by mouth daily., Disp: 30 capsule, Rfl: 0   [START ON 05/02/2024] amphetamine -dextroamphetamine  (ADDERALL XR) 20 MG 24 hr capsule, Take 1 capsule (20 mg total) by mouth every morning., Disp: 30 capsule, Rfl: 0   [START ON 05/31/2024] amphetamine -dextroamphetamine  (ADDERALL XR) 20 MG 24 hr capsule, Take 1 capsule (20 mg total) by mouth every morning., Disp: 30 capsule, Rfl: 0   amphetamine -dextroamphetamine  (ADDERALL) 5 MG tablet, Take 1 tablet (5 mg total) by mouth daily., Disp: 30 tablet, Rfl: 0   [START ON 05/02/2024] amphetamine -dextroamphetamine  (ADDERALL) 5 MG tablet, Take 1 tablet (5 mg total) by mouth daily., Disp: 30 tablet, Rfl: 0   [START ON 05/31/2024] amphetamine -dextroamphetamine  (ADDERALL) 5 MG tablet, Take 1 tablet (5 mg total) by mouth daily., Disp: 30 tablet, Rfl: 0   cetirizine (ZYRTEC) 10 MG tablet, Take 10 mg by mouth at bedtime. , Disp: , Rfl:     methocarbamol  (ROBAXIN ) 750 MG tablet, Take 1 tablet (750 mg total) by mouth daily as needed for muscle spasms., Disp: 30 tablet, Rfl: 3   Multiple Vitamin (MULTIVITAMIN) tablet, Take 1 tablet by mouth daily., Disp: , Rfl:    Rimegepant Sulfate (NURTEC) 75 MG TBDP, Take 1 tablet by mouth every other day for migraine prevention. No more than one dose in 24 hours., Disp: 16 tablet, Rfl: 2   topiramate  (TOPAMAX ) 50 MG tablet, Take 1 tablet (50 mg total) by mouth 2 (two) times daily., Disp: 60 tablet, Rfl: 5   venlafaxine  XR (EFFEXOR -XR) 75 MG 24 hr capsule, TAKE 1 CAPSULE BY MOUTH DAILY WITH BREAKFAST., Disp: 30 capsule, Rfl: 5   Medications ordered in this encounter:  Meds ordered this encounter  Medications   amoxicillin -clavulanate (AUGMENTIN ) 875-125 MG tablet    Sig: Take 1 tablet by mouth 2 (two) times daily.    Dispense:  14 tablet    Refill:  0    Supervising Provider:   LAMPTEY, PHILIP O [[id number]]   brompheniramine-pseudoephedrine-DM 30-2-10 MG/5ML syrup    Sig: Take 5 mLs by mouth 4 (four) times daily as needed.    Dispense:  120 mL    Refill:  0    Supervising Provider:   BLAISE ALEENE KIDD [[id number]]     *If you need refills on other medications prior to your next appointment, please contact your pharmacy*  Follow-Up: Call back or seek an in-person evaluation if the symptoms worsen or if the condition fails to improve as anticipated.  [facility] [phone]  Other Instructions  Upper Respiratory Infection, Adult An upper respiratory infection (URI) is a common viral infection of the nose, throat, and upper air passages that lead to the lungs. The most common type of URI is the common cold. URIs usually get better on their own, without medical treatment. What are the causes? A URI is caused by a virus. You may catch a virus by: Breathing in droplets from an infected person's cough or sneeze. Touching something that has been exposed to the virus (is  contaminated) and then touching your mouth, nose, or eyes. What increases the risk? You are more likely to get a URI if: You are very young or very old. You have close contact with others, such as at work, school, or a health care facility. You smoke. You have long-term (chronic) heart or lung disease. You have a weakened disease-fighting system (immune system). You have nasal allergies or asthma. You are experiencing a lot of stress. You have poor nutrition. What are the signs or symptoms? A URI usually involves some of the following symptoms: Runny or stuffy (congested) nose. Cough. Sneezing. Sore throat. Headache. Fatigue. Fever. Loss of appetite. Pain in your forehead, behind your eyes, and over your cheekbones (sinus pain). Muscle aches. Redness or irritation of the eyes. Pressure in the ears or face. How is this diagnosed? This condition may be diagnosed based on your medical history and symptoms, and a physical exam. Your health care provider may use a swab to take a mucus sample from your nose (nasal swab). This sample can be tested to determine what virus is causing the illness. How is this treated? URIs usually get better on their own within 7-10 days. Medicines cannot cure URIs, but your health care provider may recommend certain medicines to help relieve symptoms, such as: Over-the-counter cold medicines. Cough suppressants. Coughing is a type of defense against infection that helps to clear the respiratory system, so take these medicines only as recommended by your health care provider. Fever-reducing medicines. Follow these instructions at home: Activity Rest as needed. If you have a fever, stay home from work or school until your fever is gone or until your health care provider says your URI cannot spread to other people (is no longer contagious). Your health care provider may have you wear a face mask to prevent your infection from spreading. Relieving  symptoms Gargle with a mixture of salt and water 3-4 times a day or as needed. To make salt water, completely dissolve -1 tsp (3-6 g) of salt in 1 cup (237 mL) of warm water. Use a cool-mist humidifier to add moisture to the air. This can help you breathe more easily. Eating and drinking  Drink enough fluid to keep your urine pale yellow. Eat soups and other clear broths. General instructions  Take over-the-counter and prescription medicines only as told by your health care provider. These include cold medicines, fever reducers, and cough suppressants. Do not use any products that contain nicotine or tobacco. These products include cigarettes, chewing tobacco, and vaping devices, such as e-cigarettes. If you need help quitting, ask your health care provider. Stay away from secondhand smoke. Stay up to date on all immunizations, including the yearly (annual) flu vaccine. Keep all follow-up visits. This is important. How to prevent the spread of infection to others URIs can be contagious. To prevent the infection from spreading: Wash your hands with soap and water for at least 20 seconds. If soap and water are not available, use hand sanitizer. Avoid touching your  mouth, face, eyes, or nose. Cough or sneeze into a tissue or your sleeve or elbow instead of into your hand or into the air.  Contact a health care provider if: You are getting worse instead of better. You have a fever or chills. Your mucus is brown or red. You have yellow or brown discharge coming from your nose. You have pain in your face, especially when you bend forward. You have swollen neck glands. You have pain while swallowing. You have white areas in the back of your throat. Get help right away if: You have shortness of breath that gets worse. You have severe or persistent: Headache. Ear pain. Sinus pain. Chest pain. You have chronic lung disease along with any of the following: Making high-pitched whistling  sounds when you breathe, most often when you breathe out (wheezing). Prolonged cough (more than 14 days). Coughing up blood. A change in your usual mucus. You have a stiff neck. You have changes in your: Vision. Hearing. Thinking. Mood. These symptoms may be an emergency. Get help right away. Call 911. Do not wait to see if the symptoms will go away. Do not drive yourself to the hospital. Summary An upper respiratory infection (URI) is a common infection of the nose, throat, and upper air passages that lead to the lungs. A URI is caused by a virus. URIs usually get better on their own within 7-10 days. Medicines cannot cure URIs, but your health care provider may recommend certain medicines to help relieve symptoms. This information is not intended to replace advice given to you by your health care provider. Make sure you discuss any questions you have with your health care provider. Document Revised: 01/11/2021 Document Reviewed: 01/11/2021 Elsevier Patient Education  2024 Elsevier Inc.   If you have been instructed to have an in-person evaluation today at a local Urgent Care facility, please use the link below. It will take you to a list of all of our available [facility], including address, phone number and hours of operation. Please do not delay care.  [facility]  If you or a family member do not have a primary care provider, use the link below to schedule a visit and establish care. When you choose a [facility] primary care physician or advanced practice provider, you gain a long-term partner in health. Find a Primary Care Provider  Learn more about [facility]'s in-office and virtual care options: [facility] - Get Care Now

## 2024-04-30 ENCOUNTER — Telehealth: Payer: Self-pay

## 2024-05-01 ENCOUNTER — Other Ambulatory Visit: Payer: Self-pay | Admitting: Physician Assistant

## 2024-05-01 DIAGNOSIS — F909 Attention-deficit hyperactivity disorder, unspecified type: Secondary | ICD-10-CM

## 2024-05-01 MED ORDER — AMPHETAMINE-DEXTROAMPHET ER 20 MG PO CP24: 20.0000 mg | ORAL_CAPSULE | ORAL | 0 refills | Status: DC

## 2024-05-04 ENCOUNTER — Other Ambulatory Visit: Payer: Self-pay

## 2024-05-04 ENCOUNTER — Other Ambulatory Visit: Payer: Self-pay | Admitting: Physician Assistant

## 2024-05-04 ENCOUNTER — Telehealth: Payer: Self-pay

## 2024-05-04 DIAGNOSIS — F909 Attention-deficit hyperactivity disorder, unspecified type: Secondary | ICD-10-CM

## 2024-05-04 MED ORDER — AMPHETAMINE-DEXTROAMPHET ER 20 MG PO CP24: 20.0000 mg | ORAL_CAPSULE | ORAL | 0 refills | Status: DC

## 2024-05-04 NOTE — Telephone Encounter (Signed)
 Tried to call patient voicemail is full

## 2024-05-04 NOTE — Telephone Encounter (Signed)
 Pt notified that  med sent

## 2024-05-28 DIAGNOSIS — S93411A Sprain of calcaneofibular ligament of right ankle, initial encounter: Secondary | ICD-10-CM | POA: Diagnosis not present

## 2024-06-02 DIAGNOSIS — M65871 Other synovitis and tenosynovitis, right ankle and foot: Secondary | ICD-10-CM | POA: Diagnosis not present

## 2024-06-02 DIAGNOSIS — S93491D Sprain of other ligament of right ankle, subsequent encounter: Secondary | ICD-10-CM | POA: Diagnosis not present

## 2024-06-02 DIAGNOSIS — M25871 Other specified joint disorders, right ankle and foot: Secondary | ICD-10-CM | POA: Diagnosis not present

## 2024-06-02 DIAGNOSIS — M898X7 Other specified disorders of bone, ankle and foot: Secondary | ICD-10-CM | POA: Diagnosis not present

## 2024-06-09 DIAGNOSIS — M65871 Other synovitis and tenosynovitis, right ankle and foot: Secondary | ICD-10-CM | POA: Diagnosis not present

## 2024-07-02 ENCOUNTER — Encounter: Payer: Self-pay | Admitting: Physician Assistant

## 2024-07-02 ENCOUNTER — Ambulatory Visit: Admitting: Physician Assistant

## 2024-07-02 VITALS — BP 110/83 | HR 84 | Temp 98.0°F | Resp 16 | Ht 78.0 in | Wt 298.0 lb

## 2024-07-02 DIAGNOSIS — E782 Mixed hyperlipidemia: Secondary | ICD-10-CM | POA: Diagnosis not present

## 2024-07-02 DIAGNOSIS — G43111 Migraine with aura, intractable, with status migrainosus: Secondary | ICD-10-CM

## 2024-07-02 DIAGNOSIS — E669 Obesity, unspecified: Secondary | ICD-10-CM

## 2024-07-02 DIAGNOSIS — F909 Attention-deficit hyperactivity disorder, unspecified type: Secondary | ICD-10-CM | POA: Diagnosis not present

## 2024-07-02 DIAGNOSIS — R5383 Other fatigue: Secondary | ICD-10-CM | POA: Diagnosis not present

## 2024-07-02 DIAGNOSIS — M24171 Other articular cartilage disorders, right ankle: Secondary | ICD-10-CM

## 2024-07-02 DIAGNOSIS — Z1329 Encounter for screening for other suspected endocrine disorder: Secondary | ICD-10-CM

## 2024-07-02 MED ORDER — AMPHETAMINE-DEXTROAMPHETAMINE 5 MG PO TABS: 5.0000 mg | ORAL_TABLET | Freq: Every day | ORAL | 0 refills | Status: AC

## 2024-07-02 MED ORDER — AMPHETAMINE-DEXTROAMPHET ER 20 MG PO CP24: 20.0000 mg | ORAL_CAPSULE | Freq: Every day | ORAL | 0 refills | Status: AC

## 2024-07-02 MED ORDER — AMPHETAMINE-DEXTROAMPHET ER 20 MG PO CP24: 20.0000 mg | ORAL_CAPSULE | ORAL | 0 refills | Status: AC

## 2024-07-02 NOTE — Progress Notes (Signed)
 [facility] [address] [place], [place] [address]  Internal MEDICINE  Office Visit Note  Patient Name: Ethan Gomez  [id number]  [id number]  Date of Service: 07/02/2024  Chief Complaint  Patient presents with   Follow-up   Gastroesophageal Reflux    HPI Pt is here for routine follow up -Will be having right ankle surgery next week, has been in a boot for the last 3 weeks. Will be NWB for 2 weeks then walking cast with limited walking a work, then CAM boot -doing well with topamax , nurtec and venlafaxine . No migraines for the last 1.44months -adderrall also working well, does not take the 5mg  everyday, just if needed. No S/E -still working on weight loss, down 4lbs despite boot -due for labs for CPE  Current Medication: Outpatient Encounter Medications as of 07/02/2024  Medication Sig   amphetamine -dextroamphetamine  (ADDERALL) 5 MG tablet Take 1 tablet (5 mg total) by mouth daily.   cetirizine (ZYRTEC) 10 MG tablet Take 10 mg by mouth at bedtime.    methocarbamol  (ROBAXIN ) 750 MG tablet Take 1 tablet (750 mg total) by mouth daily as needed for muscle spasms.   Multiple Vitamin (MULTIVITAMIN) tablet Take 1 tablet by mouth daily.   Rimegepant Sulfate (NURTEC) 75 MG TBDP Take 1 tablet by mouth every other day for migraine prevention. No more than one dose in 24 hours.   topiramate  (TOPAMAX ) 50 MG tablet Take 1 tablet (50 mg total) by mouth 2 (two) times daily.   traMADol  (ULTRAM ) 50 MG tablet Take 50 mg by mouth every 8 (eight) hours.   venlafaxine  XR (EFFEXOR -XR) 75 MG 24 hr capsule TAKE 1 CAPSULE BY MOUTH DAILY WITH BREAKFAST.   [DISCONTINUED] amoxicillin -clavulanate (AUGMENTIN ) 875-125 MG tablet Take 1 tablet by mouth 2 (two) times daily.   [DISCONTINUED] amphetamine -dextroamphetamine  (ADDERALL XR) 20 MG 24 hr capsule Take 1 capsule (20 mg total) by mouth daily.   [DISCONTINUED] amphetamine -dextroamphetamine  (ADDERALL XR) 20 MG 24 hr capsule Take 1 capsule (20 mg total)  by mouth every morning.   [DISCONTINUED] amphetamine -dextroamphetamine  (ADDERALL XR) 20 MG 24 hr capsule Take 1 capsule (20 mg total) by mouth every morning.   [DISCONTINUED] amphetamine -dextroamphetamine  (ADDERALL) 5 MG tablet Take 1 tablet (5 mg total) by mouth daily.   [DISCONTINUED] amphetamine -dextroamphetamine  (ADDERALL) 5 MG tablet Take 1 tablet (5 mg total) by mouth daily.   [DISCONTINUED] brompheniramine-pseudoephedrine-DM 30-2-10 MG/5ML syrup Take 5 mLs by mouth 4 (four) times daily as needed.   amphetamine -dextroamphetamine  (ADDERALL XR) 20 MG 24 hr capsule Take 1 capsule (20 mg total) by mouth every morning.   [START ON 08/03/2024] amphetamine -dextroamphetamine  (ADDERALL XR) 20 MG 24 hr capsule Take 1 capsule (20 mg total) by mouth every morning.   [START ON 08/30/2024] amphetamine -dextroamphetamine  (ADDERALL XR) 20 MG 24 hr capsule Take 1 capsule (20 mg total) by mouth daily.   [START ON 07/28/2024] amphetamine -dextroamphetamine  (ADDERALL) 5 MG tablet Take 1 tablet (5 mg total) by mouth daily.   [START ON 08/24/2024] amphetamine -dextroamphetamine  (ADDERALL) 5 MG tablet Take 1 tablet (5 mg total) by mouth daily.   No facility-administered encounter medications on file as of 07/02/2024.    Surgical History: Past Surgical History:  Procedure Laterality Date   BONE EXOSTOSIS EXCISION Left 01/27/2015   Procedure: EXCISION LEFT HEEL OS CALCANEAL SPUR ;  Surgeon: Toribio JULIANNA Chancy, MD;  Location: [facility];  Service: Orthopedics;  Laterality: Left;  ANESTHESIA: GENERAL, BLOCK   left ankle surgery     TENDON REPAIR Left 01/27/2015  Procedure: REPAIR LEFT PERONEUS LONGUS TENDON;  Surgeon: Toribio JULIANNA Chancy, MD;  Location: [facility];  Service: Orthopedics;  Laterality: Left;   VASECTOMY N/A 11/20/2019   Procedure: VASECTOMY;  Surgeon: Francisca Redell BROCKS, MD;  Location: [facility];  Service: Urology;  Laterality: N/A;    Medical History: Past Medical History:  Diagnosis Date    Acid reflux    OCC-TUMS PRN   ADHD (attention deficit hyperactivity disorder)    Calcaneal spur of left foot 12/2014   Family history of adverse reaction to anesthesia    pt's mother has hx. of post-op N/V   Migraines    MIGRAINES   Rupture of tendon 12/2014   left ankle and foot    Family History: Family History  Problem Relation Age of Onset   Anesthesia problems Mother        post-op N/V   Arthritis Mother    Hyperlipidemia Mother    Hypertension Mother    Arthritis Other        all 4 of his grandparents   Hyperlipidemia Other        all grandparents   Hypertension Maternal Grandmother    Hypertension Paternal Grandfather    Diabetes Maternal Grandfather     Social History   Socioeconomic History   Marital status: Married    Spouse name: Not on file   Number of children: Not on file   Years of education: Not on file   Highest education level: Not on file  Occupational History   Not on file  Tobacco Use   Smoking status: Never   Smokeless tobacco: Former   Tobacco comments:    no smokeless tobacco since age 74  Vaping Use   Vaping status: Never Used  Substance and Sexual Activity   Alcohol use: Yes    Alcohol/week: 0.0 standard drinks of alcohol    Comment: rare   Drug use: No   Sexual activity: Yes  Other Topics Concern   Not on file  Social History Narrative   Not on file   Social Drivers of Health   Tobacco Use: Medium Risk (07/02/2024)   Patient History    Smoking Tobacco Use: Never    Smokeless Tobacco Use: Former    Passive Exposure: Not on [place] Strain: Not on file  Food Insecurity: Not on file  Transportation Needs: Not on file  Physical Activity: Not on file  Stress: Not on file  Social Connections: Not on file  Intimate Partner Violence: Not on file  Depression (PHQ2-9): Low Risk (07/02/2024)   Depression (PHQ2-9)    PHQ-2 Score: 0  Alcohol Screen: Low Risk (12/26/2023)   Alcohol Screen    Last Alcohol Screening  Score (AUDIT): 2  Housing: Not on file  Utilities: Not on file  Health Literacy: Not on file      Review of Systems  Constitutional:  Negative for fever.  HENT:  Negative for congestion, mouth sores and postnasal drip.   Eyes:  Negative for visual disturbance.  Respiratory:  Negative for cough.   Cardiovascular:  Negative for chest pain.  Gastrointestinal:  Negative for anal bleeding.  Genitourinary:  Negative for flank pain.  Musculoskeletal:  Positive for arthralgias and gait problem.  Skin:  Negative for rash.  Neurological:  Negative for syncope.  Psychiatric/Behavioral:  Negative for dysphoric mood. The patient is not nervous/anxious.     Vital Signs: BP 110/83   Pulse 84   Temp 98 F (36.7  C)   Resp 16   Ht 6' 6 (1.981 m)   Wt 298 lb (135.2 kg)   SpO2 98%   BMI 34.44 kg/m    Physical Exam Vitals and nursing note reviewed.  Constitutional:      General: He is not in acute distress.    Appearance: Normal appearance. He is well-developed. He is obese. He is not diaphoretic.  HENT:     Head: Normocephalic and atraumatic.  Eyes:     Extraocular Movements: Extraocular movements intact.  Neck:     Thyroid: No thyromegaly.     Vascular: No JVD.     Trachea: No tracheal deviation.  Cardiovascular:     Rate and Rhythm: Normal rate and regular rhythm.     Heart sounds: Normal heart sounds. No murmur heard.    No friction rub. No gallop.  Pulmonary:     Effort: Pulmonary effort is normal.  Musculoskeletal:        General: Normal range of motion.     Comments: Boot on right foot, using crutch  Skin:    General: Skin is warm and dry.  Neurological:     Mental Status: He is alert and oriented to person, place, and time.     Gait: Gait abnormal.  Psychiatric:        Behavior: Behavior normal.        Thought Content: Thought content normal.        Judgment: Judgment normal.        Assessment/Plan: 1. Derangement of right ankle (Primary) Followed by  ortho surgery with plan for surgery next week  2. Attention deficit hyperactivity disorder (ADHD), unspecified ADHD type May continue adderall as before, refills sent - amphetamine -dextroamphetamine  (ADDERALL) 5 MG tablet; Take 1 tablet (5 mg total) by mouth daily.  Dispense: 30 tablet; Refill: 0 - amphetamine -dextroamphetamine  (ADDERALL) 5 MG tablet; Take 1 tablet (5 mg total) by mouth daily.  Dispense: 30 tablet; Refill: 0 - amphetamine -dextroamphetamine  (ADDERALL XR) 20 MG 24 hr capsule; Take 1 capsule (20 mg total) by mouth every morning.  Dispense: 30 capsule; Refill: 0 - amphetamine -dextroamphetamine  (ADDERALL XR) 20 MG 24 hr capsule; Take 1 capsule (20 mg total) by mouth every morning.  Dispense: 30 capsule; Refill: 0 - amphetamine -dextroamphetamine  (ADDERALL XR) 20 MG 24 hr capsule; Take 1 capsule (20 mg total) by mouth daily.  Dispense: 30 capsule; Refill: 0 [place] Controlled Substance Database was reviewed by me for overdose risk score (ORS) Refilled Controlled medications today. Reviewed risks and possible side effects associated with taking Stimulants. Combination of these drugs with other psychotropic medications could cause dizziness and drowsiness. Pt needs to Monitor symptoms and exercise caution in driving and operating heavy machinery to avoid damages to oneself, to others and to the surroundings. Patient verbalized understanding in this matter. Dependence and abuse for these drugs will be monitored closely. A Controlled substance policy and procedure is on file which allows [facility] to order a urine drug screen test at any visit. Patient understands and agrees with the plan..  3. Intractable migraine with aura with status migrainosus Improved, continue current medications  4. Mixed hyperlipidemia - Lipid Panel With LDL/HDL Ratio  5. Thyroid disorder screen - TSH + free T4  6. Other fatigue - CBC w/Diff/Platelet - Comprehensive metabolic panel with GFR - TSH +  free T4 - Lipid Panel With LDL/HDL Ratio  7. Obesity (BMI 30-39.9) Down 4lbs despite walking boot on right ankle, continue to work on diet  General Counseling: Ethan Gomez understanding of the findings of todays visit and agrees with plan of treatment. I have discussed any further diagnostic evaluation that may be needed or ordered today. We also reviewed his medications today. he has been encouraged to call the office with any questions or concerns that should arise related to todays visit.    Orders Placed This Encounter  Procedures   CBC w/Diff/Platelet   Comprehensive metabolic panel with GFR   TSH + free T4   Lipid Panel With LDL/HDL Ratio    Meds ordered this encounter  Medications   amphetamine -dextroamphetamine  (ADDERALL) 5 MG tablet    Sig: Take 1 tablet (5 mg total) by mouth daily.    Dispense:  30 tablet    Refill:  0   amphetamine -dextroamphetamine  (ADDERALL) 5 MG tablet    Sig: Take 1 tablet (5 mg total) by mouth daily.    Dispense:  30 tablet    Refill:  0   amphetamine -dextroamphetamine  (ADDERALL XR) 20 MG 24 hr capsule    Sig: Take 1 capsule (20 mg total) by mouth every morning.    Dispense:  30 capsule    Refill:  0   amphetamine -dextroamphetamine  (ADDERALL XR) 20 MG 24 hr capsule    Sig: Take 1 capsule (20 mg total) by mouth every morning.    Dispense:  30 capsule    Refill:  0    Please fill today   amphetamine -dextroamphetamine  (ADDERALL XR) 20 MG 24 hr capsule    Sig: Take 1 capsule (20 mg total) by mouth daily.    Dispense:  30 capsule    Refill:  0    This patient was seen by Tinnie Pro, PA-C in collaboration with Dr. Sigrid Bathe as a part of collaborative care agreement.   Total time spent:30 Minutes Time spent includes review of chart, medications, test results, and follow up plan with the patient.      Dr Fozia M Khan Internal medicine

## 2024-07-19 ENCOUNTER — Other Ambulatory Visit: Payer: Self-pay | Admitting: Physician Assistant

## 2024-07-19 DIAGNOSIS — G43111 Migraine with aura, intractable, with status migrainosus: Secondary | ICD-10-CM

## 2024-10-02 ENCOUNTER — Encounter: Admitting: Physician Assistant
